# Patient Record
Sex: Male | Born: 1945 | Race: White | Hispanic: No | Smoking: Never smoker
Health system: Southern US, Community
[De-identification: ages and names within clinical notes are randomized; demographics above are authoritative.]

## PROBLEM LIST (undated history)

## (undated) DIAGNOSIS — I509 Heart failure, unspecified: Secondary | ICD-10-CM

## (undated) DIAGNOSIS — M109 Gout, unspecified: Secondary | ICD-10-CM

## (undated) DIAGNOSIS — D649 Anemia, unspecified: Secondary | ICD-10-CM

## (undated) DIAGNOSIS — C439 Malignant melanoma of skin, unspecified: Secondary | ICD-10-CM

## (undated) DIAGNOSIS — I255 Ischemic cardiomyopathy: Secondary | ICD-10-CM

## (undated) DIAGNOSIS — E7800 Pure hypercholesterolemia, unspecified: Secondary | ICD-10-CM

## (undated) DIAGNOSIS — E119 Type 2 diabetes mellitus without complications: Secondary | ICD-10-CM

## (undated) DIAGNOSIS — I214 Non-ST elevation (NSTEMI) myocardial infarction: Secondary | ICD-10-CM

## (undated) DIAGNOSIS — C4441 Basal cell carcinoma of skin of scalp and neck: Secondary | ICD-10-CM

## (undated) DIAGNOSIS — I10 Essential (primary) hypertension: Secondary | ICD-10-CM

## (undated) DIAGNOSIS — IMO0002 Reserved for concepts with insufficient information to code with codable children: Secondary | ICD-10-CM

## (undated) DIAGNOSIS — I252 Old myocardial infarction: Secondary | ICD-10-CM

## (undated) DIAGNOSIS — M1990 Unspecified osteoarthritis, unspecified site: Secondary | ICD-10-CM

## (undated) DIAGNOSIS — N184 Chronic kidney disease, stage 4 (severe): Secondary | ICD-10-CM

## (undated) HISTORY — DX: Ischemic cardiomyopathy: I25.5

## (undated) HISTORY — PX: MELANOMA EXCISION: SHX5266

## (undated) HISTORY — DX: Heart failure, unspecified: I50.9

## (undated) HISTORY — DX: Chronic kidney disease, stage 4 (severe): N18.4

## (undated) HISTORY — DX: Old myocardial infarction: I25.2

## (undated) HISTORY — PX: MOHS SURGERY: SUR867

## (undated) HISTORY — DX: Non-ST elevation (NSTEMI) myocardial infarction: I21.4

---
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
## 1997-03-11 HISTORY — PX: CHOLECYSTECTOMY: SHX55

## 1997-03-11 HISTORY — PX: ENUCLEATION: SHX628

## 1999-03-12 HISTORY — PX: BELOW KNEE LEG AMPUTATION: SUR23

## 2006-03-23 ENCOUNTER — Emergency Department (HOSPITAL_COMMUNITY): Admission: EM | Admit: 2006-03-23 | Discharge: 2006-03-23 | Payer: Self-pay | Admitting: Emergency Medicine

## 2012-12-09 HISTORY — PX: CORONARY ANGIOPLASTY WITH STENT PLACEMENT: SHX49

## 2013-01-28 ENCOUNTER — Telehealth: Payer: Self-pay | Admitting: Cardiology

## 2013-01-28 NOTE — Telephone Encounter (Signed)
ROI faxed to [facility]

## 2013-02-03 ENCOUNTER — Encounter (INDEPENDENT_AMBULATORY_CARE_PROVIDER_SITE_OTHER): Payer: Self-pay

## 2013-02-03 ENCOUNTER — Other Ambulatory Visit: Payer: Self-pay | Admitting: *Deleted

## 2013-02-03 ENCOUNTER — Encounter: Payer: Self-pay | Admitting: Cardiology

## 2013-02-03 ENCOUNTER — Ambulatory Visit (INDEPENDENT_AMBULATORY_CARE_PROVIDER_SITE_OTHER): Payer: Medicare HMO | Admitting: Cardiology

## 2013-02-03 VITALS — BP 148/78 | HR 82 | Ht 74.0 in | Wt 221.0 lb

## 2013-02-03 DIAGNOSIS — I2510 Atherosclerotic heart disease of native coronary artery without angina pectoris: Secondary | ICD-10-CM

## 2013-02-03 DIAGNOSIS — E1159 Type 2 diabetes mellitus with other circulatory complications: Secondary | ICD-10-CM

## 2013-02-03 DIAGNOSIS — S88119A Complete traumatic amputation at level between knee and ankle, unspecified lower leg, initial encounter: Secondary | ICD-10-CM

## 2013-02-03 DIAGNOSIS — I798 Other disorders of arteries, arterioles and capillaries in diseases classified elsewhere: Secondary | ICD-10-CM

## 2013-02-03 DIAGNOSIS — Z89511 Acquired absence of right leg below knee: Secondary | ICD-10-CM

## 2013-02-03 DIAGNOSIS — E785 Hyperlipidemia, unspecified: Secondary | ICD-10-CM

## 2013-02-03 DIAGNOSIS — N184 Chronic kidney disease, stage 4 (severe): Secondary | ICD-10-CM

## 2013-02-03 DIAGNOSIS — I252 Old myocardial infarction: Secondary | ICD-10-CM

## 2013-02-03 DIAGNOSIS — I208 Other forms of angina pectoris: Secondary | ICD-10-CM

## 2013-02-03 DIAGNOSIS — E1151 Type 2 diabetes mellitus with diabetic peripheral angiopathy without gangrene: Secondary | ICD-10-CM

## 2013-02-03 DIAGNOSIS — I2589 Other forms of chronic ischemic heart disease: Secondary | ICD-10-CM

## 2013-02-03 DIAGNOSIS — I255 Ischemic cardiomyopathy: Secondary | ICD-10-CM | POA: Insufficient documentation

## 2013-02-03 HISTORY — DX: Old myocardial infarction: I25.2

## 2013-02-03 LAB — BASIC METABOLIC PANEL
BUN: 47 mg/dL — ABNORMAL HIGH (ref 6–23)
CO2: 21 mEq/L (ref 19–32)
Chloride: 112 mEq/L (ref 96–112)
Creatinine, Ser: 2.6 mg/dL — ABNORMAL HIGH (ref 0.4–1.5)
Potassium: 4.8 mEq/L (ref 3.5–5.1)

## 2013-02-03 MED ORDER — ROSUVASTATIN CALCIUM 10 MG PO TABS: ORAL_TABLET | ORAL | Status: DC

## 2013-02-03 MED ORDER — ROSUVASTATIN CALCIUM 10 MG PO TABS: 10.0000 mg | ORAL_TABLET | Freq: Every day | ORAL | Status: DC
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
## 2013-02-03 NOTE — Patient Instructions (Signed)
Your physician recommends that you schedule a follow-up appointment in: 3 MONTHS WITH DR Anne Fu  START CRESTOR 5 MG ONCE WEEKLY  Your physician recommends that you HAVE LAB WORK TODAY

## 2013-02-19 ENCOUNTER — Encounter: Payer: Self-pay | Admitting: Cardiology

## 2013-03-31 ENCOUNTER — Other Ambulatory Visit (HOSPITAL_COMMUNITY): Payer: Self-pay | Admitting: Cardiology

## 2013-03-31 DIAGNOSIS — N179 Acute kidney failure, unspecified: Secondary | ICD-10-CM

## 2013-04-05 ENCOUNTER — Ambulatory Visit (HOSPITAL_COMMUNITY): Payer: Medicare HMO | Attending: Cardiology

## 2013-04-05 ENCOUNTER — Encounter: Payer: Self-pay | Admitting: Cardiology

## 2013-04-05 DIAGNOSIS — I10 Essential (primary) hypertension: Secondary | ICD-10-CM

## 2013-04-05 DIAGNOSIS — E785 Hyperlipidemia, unspecified: Secondary | ICD-10-CM | POA: Insufficient documentation

## 2013-04-05 DIAGNOSIS — I701 Atherosclerosis of renal artery: Secondary | ICD-10-CM | POA: Insufficient documentation

## 2013-04-05 DIAGNOSIS — E119 Type 2 diabetes mellitus without complications: Secondary | ICD-10-CM | POA: Insufficient documentation

## 2013-04-05 DIAGNOSIS — I129 Hypertensive chronic kidney disease with stage 1 through stage 4 chronic kidney disease, or unspecified chronic kidney disease: Secondary | ICD-10-CM | POA: Insufficient documentation

## 2013-04-05 DIAGNOSIS — N179 Acute kidney failure, unspecified: Secondary | ICD-10-CM

## 2013-04-05 DIAGNOSIS — N184 Chronic kidney disease, stage 4 (severe): Secondary | ICD-10-CM

## 2013-04-05 DIAGNOSIS — I2510 Atherosclerotic heart disease of native coronary artery without angina pectoris: Secondary | ICD-10-CM | POA: Insufficient documentation

## 2013-05-06 ENCOUNTER — Ambulatory Visit: Payer: Medicare HMO | Admitting: Cardiology

## 2013-06-02 ENCOUNTER — Encounter: Payer: Self-pay | Admitting: Cardiology

## 2013-06-02 ENCOUNTER — Ambulatory Visit (INDEPENDENT_AMBULATORY_CARE_PROVIDER_SITE_OTHER): Payer: Medicare HMO | Admitting: Cardiology

## 2013-06-02 VITALS — BP 160/77 | HR 66 | Ht 74.0 in | Wt 231.0 lb

## 2013-06-02 DIAGNOSIS — I2589 Other forms of chronic ischemic heart disease: Secondary | ICD-10-CM

## 2013-06-02 DIAGNOSIS — E7800 Pure hypercholesterolemia, unspecified: Secondary | ICD-10-CM

## 2013-06-02 DIAGNOSIS — N184 Chronic kidney disease, stage 4 (severe): Secondary | ICD-10-CM

## 2013-06-02 DIAGNOSIS — I252 Old myocardial infarction: Secondary | ICD-10-CM

## 2013-06-02 DIAGNOSIS — I2510 Atherosclerotic heart disease of native coronary artery without angina pectoris: Secondary | ICD-10-CM

## 2013-06-02 DIAGNOSIS — I255 Ischemic cardiomyopathy: Secondary | ICD-10-CM
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
## 2013-06-02 NOTE — Patient Instructions (Signed)
You have been referred to [facility]  ([facility]).  Your physician wants you to follow-up in: 6 months with Dr. Marlou Porch. You will receive a reminder letter in the mail two months in advance. If you don't receive a letter, please call our office to schedule the follow-up appointment.

## 2013-06-03 ENCOUNTER — Encounter (INDEPENDENT_AMBULATORY_CARE_PROVIDER_SITE_OTHER): Payer: Commercial Managed Care - HMO | Admitting: Pharmacist

## 2013-06-03 ENCOUNTER — Ambulatory Visit (INDEPENDENT_AMBULATORY_CARE_PROVIDER_SITE_OTHER): Payer: Commercial Managed Care - HMO | Admitting: Pharmacist

## 2013-06-03 VITALS — Wt 228.0 lb

## 2013-06-03 DIAGNOSIS — E785 Hyperlipidemia, unspecified: Secondary | ICD-10-CM | POA: Insufficient documentation

## 2013-06-03 DIAGNOSIS — Z79899 Other long term (current) drug therapy: Secondary | ICD-10-CM | POA: Diagnosis not present

## 2013-06-03 MED ORDER — EZETIMIBE 10 MG PO TABS: 10.0000 mg | ORAL_TABLET | Freq: Every day | ORAL | Status: DC

## 2013-06-03 NOTE — Assessment & Plan Note (Signed)
Patient and I discussed treatment options at length.  He is interested in enrolling into PCSK-9 study.  Would need to get some of his old records before attempting to enroll to make sure he met all criteria.  Would prefer to get patient on Zetia daily first to make sure he is on therapy (in case he gets randomized to placebo arm), and he is agreeable to this.  Cost may be an issue, but he states he wants to take this is possible, but will call if too expensive or if he has side effects.  Gave some samples and a voucher for Zetia today.  Will recheck blood work in 8 weeks, and if LDL still elevated, will try to enroll.  If he can't tolerate Zetia, will try to enroll as well.  He has had issues with constipation in the past, so won't try Welchol at this time (also expensive). Plan: 1.  Start Zetia 10 mg once daily. 2.  Get back to walking 3 miles daily. 3.  Continue to limit fried foods. 4.  Recheck cholesterol and liver in 8 weeks (07/28/13 fasting labs), and see Robert Villanueva 1 day later (07/29/13 at 2:00) 5.  Call Robert Villanueva at [phone] if you have side effects from [place], and will try to get you into clinical trial.

## 2013-06-03 NOTE — Patient Instructions (Signed)
1.  Start Zetia 10 mg once daily. 2.  Get back to walking 3 miles daily. 3.  Continue to limit fried foods. 4.  Recheck cholesterol and liver in 8 weeks (07/28/13 fasting labs), and see Ysidro Evert 1 day later (07/29/13 at 2:00) 5.  Call Ysidro Evert at [phone] if you have side effects from [place], and will try to get you into clinical trial.

## 2013-06-03 NOTE — Progress Notes (Signed)
Patient is a pleasant 68 y.o. WM referred to lipid clinic by Dr. Marlou Porch.  He has an MI 12/2012 and required PCI to RCA x 2.  He hasn't been able to tolerate pravastatin 20 mg qd or Crestor 5 mg qd.  He never tried once weekly crestor as mentioned in one of the notes, and he is certain he doesn't want to try statins again.  Cost for medication could be an issue for patient as well.  Patient interested in both non-statin options, and in enrolling into PCSK-9 inhibitor study.  He sees nephrology (Dr. Marval Regal) for his CKD.  His Scr is ~ 2.5 mg/dL.  Patient is single and lives alone.  He is originally from [place], then moved to [place] where he saw physicians from [place].  He moves up here in 2014.  His last cholesterol panel was 03/2013 by PCP ([facility]) and LDL was 64, TC 128, TG 151, HDL 34 while on Crestor 5 mg qd.  He has to stop this soon after due to muscle aches.  Patient had skin cancer removed 3 years ago by dermatologist at [facility].  Will get records sent to [place] to determine if he is a candidate for PCSK-9 in future based on cancer risk in the future.  RF:  CAD (MI and PCI), HTN, CKD, age, low HDL - LDL goal < 70, non-HDL goal < 100 Meds:  Not on lipid lowering meds. Intolerant:  Pravastatin 20 mg qd, Crestor 5 mg qd (muscle aches)  Diet:  Patient struggles with this as he is a single male and doesn't cook.  He often skips breakfast, and eats a chicken taco for lunch.  He doesn't eat fried foods or french fries when he eats out.  Dinner is typically eating out as well.  Typically chicken and vegetables.  Doesn't eat red meat or fried foods often.  Doesn't drink soda.  Rarely drinks alcohol. Rarely eats desserts or anything sweet.  He knows this isn't good, but this is what is realistic for him. Exercise:  Walking daily for 1-1.5 miles per day.  Plans on increasing to 3 miles daily as he use to do. Social history:  Non-smoker.  Drinks wine once or twice per month.  Labs: 03/2013:  When he was taking Crestor 5  mg qd (LDL 64, TC 128, TG 151, HDL 34) - stopped Crestor soon after.  No longer taking lipid lowering medication.  Current Outpatient Prescriptions  Medication Sig Dispense Refill  . aspirin 325 MG tablet Take 325 mg by mouth daily.      . clopidogrel (PLAVIX) 75 MG tablet Take 75 mg by mouth daily.       Marland Kitchen doxazosin (CARDURA) 4 MG tablet Take 4 mg by mouth daily.       . ergocalciferol (VITAMIN D2) 50000 UNITS capsule Take 50,000 Units by mouth once a week.      Marland Kitchen glipiZIDE (GLUCOTROL) 5 MG tablet Take 5 mg by mouth daily before breakfast.       . LANTUS 100 UNIT/ML injection Inject 6 Units into the skin. 8 units in evening      . metoprolol tartrate (LOPRESSOR) 25 MG tablet Take 25 mg by mouth 2 (two) times daily.       Marland Kitchen NITROSTAT 0.4 MG SL tablet Place 0.4 mg under the tongue every 5 (five) minutes as needed for chest pain.        No current facility-administered medications for this visit.   Allergies  Allergen Reactions  .  Crestor [Rosuvastatin]     Crestor 5 mg daily caused severe muscle aches  . Pravastatin     20 mg pravastatin caused muscle aches and itching   Family History  Problem Relation Age of Onset  . Cancer Mother   . Stroke Father   . Coronary artery disease Father   . Other Brother     Necrotic heart valve

## 2013-06-08 ENCOUNTER — Telehealth: Payer: Self-pay | Admitting: Cardiology

## 2013-06-08 NOTE — Telephone Encounter (Signed)
ROI faxed to [place]

## 2013-06-25 ENCOUNTER — Telehealth: Payer: Self-pay | Admitting: Cardiology

## 2013-06-25 NOTE — Telephone Encounter (Signed)
Records Rec From [facility] gave to Operator Room 4.17.15/kdm

## 2013-06-28 ENCOUNTER — Telehealth: Payer: Self-pay | Admitting: Cardiology

## 2013-06-28 NOTE — Telephone Encounter (Deleted)
Error

## 2013-07-09 NOTE — Telephone Encounter (Signed)
Please close encounter, Thanks! SR

## 2013-07-27 ENCOUNTER — Telehealth: Payer: Self-pay | Admitting: Cardiovascular Disease

## 2013-07-27 NOTE — Telephone Encounter (Signed)
New problem        [phone]   Dr Marval Regal would like this pt eval for a poss. c02 angiogram please.   Can we find an appropriate appointement time please.

## 2013-07-27 NOTE — Telephone Encounter (Signed)
I have left a voicemail for Dr [employer] office to contact me in regards to scheduling an appointment with Dr Fletcher Anon.

## 2013-07-27 NOTE — Telephone Encounter (Signed)
I spoke with Stanton Kidney and scheduled the pt for consult with Dr Fletcher Anon on 08/17/13. Stanton Kidney will fax renal duplex and office visit notes.

## 2013-07-28 ENCOUNTER — Other Ambulatory Visit (INDEPENDENT_AMBULATORY_CARE_PROVIDER_SITE_OTHER): Payer: Medicare HMO

## 2013-07-28 DIAGNOSIS — E785 Hyperlipidemia, unspecified: Secondary | ICD-10-CM

## 2013-07-28 DIAGNOSIS — Z79899 Other long term (current) drug therapy: Secondary | ICD-10-CM

## 2013-07-28 LAB — LIPID PANEL
CHOL/HDL RATIO: 6
Cholesterol: 170 mg/dL (ref 0–200)
HDL: 30.5 mg/dL — AB (ref >39.00)
LDL Cholesterol: 112 mg/dL — ABNORMAL HIGH (ref 0–99)
Triglycerides: 137 mg/dL (ref 0.0–149.0)
VLDL: 27.4 mg/dL (ref 0.0–40.0)

## 2013-07-28 LAB — HEPATIC FUNCTION PANEL
ALK PHOS: 143 U/L — AB (ref 39–117)
ALT: 18 U/L (ref 0–53)
AST: 14 U/L (ref 0–37)
Albumin: 3.6 g/dL (ref 3.5–5.2)
BILIRUBIN TOTAL: 0.5 mg/dL (ref 0.2–1.2)
Bilirubin, Direct: 0 mg/dL (ref 0.0–0.3)
Total Protein: 6.3 g/dL (ref 6.0–8.3)

## 2013-07-29 ENCOUNTER — Ambulatory Visit: Payer: Medicare HMO | Admitting: Pharmacist

## 2013-08-05 ENCOUNTER — Ambulatory Visit (INDEPENDENT_AMBULATORY_CARE_PROVIDER_SITE_OTHER): Payer: Commercial Managed Care - HMO | Admitting: Pharmacist

## 2013-08-05 VITALS — Wt 227.0 lb

## 2013-08-05 DIAGNOSIS — E785 Hyperlipidemia, unspecified: Secondary | ICD-10-CM

## 2013-08-05 NOTE — Progress Notes (Signed)
Patient is a pleasant 68 y.o. WM referred to lipid clinic by Dr. Marlou Porch.  He has an MI 12/2012 and required PCI to RCA x 2.  He hasn't been able to tolerate pravastatin 20 mg qd or Crestor 5 mg qd.  He never tried once weekly crestor as mentioned in one of the notes, and he is certain he doesn't want to try statins again.  Cost for medication could be an issue for patient as well.  Patient interested in both non-statin options, and in enrolling into PCSK-9 inhibitor study.  He sees nephrology (Dr. Marval Regal) for his CKD.  His Scr was ~  2.5 mg/dL in past, however patient thinks it improved recently.  Patient is single and lives alone.  He is originally from [place], then moved to [place] where he saw physicians from [place].  He moves up here in 2014. His cholesterol panel was 03/2013 by PCP ([facility]) and LDL was 64, TC 128, TG 151, HDL 34 while on Crestor 5 mg qd.  He has to stop this soon after due to muscle aches.  His LDL is 112 mg/dL now off therapy.  Wasn't able to stay on Zetia due to cost issues.  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  Patient interested in PCSK-9 inhibitor study, but wants to make sure that Dr. Marval Regal doesn't have any objection to this.  RF:  CAD (MI and PCI), HTN, CKD, age, low HDL - LDL goal < 70, non-HDL goal < 100 Meds:  Not on lipid lowering meds. Intolerant:  Pravastatin 20 mg qd ([facility]), Crestor 5 mg qd [facility] physicians), Crestor qweek [facility]) -muscle aches.  Diet:  Patient struggles with this as he is a single male and doesn't cook.  He often skips breakfast, and eats a chicken taco for lunch.  He doesn't eat fried foods or french fries when he eats out.  Dinner is typically eating out as well.  Typically chicken and vegetables.  Doesn't eat red meat or fried foods often.  Doesn't drink soda.  Rarely drinks alcohol. Rarely eats desserts or anything sweet.  He knows this isn't good, but this is what is realistic for  him. Exercise:  Walking daily for 1-1.5 miles per day.  Plans on increasing to 3 miles daily as he use to do. Social history:  Non-smoker.  Drinks wine once or twice per month.  Labs: 07/2013:  TC 170, LDL 112, HDL 31, TG 137 (off lipid lowering medication for past 6 week) 03/2013:  When he was taking Crestor 5 mg qd (LDL 64, TC 128, TG 151, HDL 34) - stopped Crestor soon after.  No longer taking lipid lowering medication.  Current Outpatient Prescriptions  Medication Sig Dispense Refill  . aspirin 325 MG tablet Take 325 mg by mouth daily.      . clopidogrel (PLAVIX) 75 MG tablet Take 75 mg by mouth daily.       Marland Kitchen doxazosin (CARDURA) 4 MG tablet Take 4 mg by mouth daily.       . ergocalciferol (VITAMIN D2) 50000 UNITS capsule Take 50,000 Units by mouth once a week.      Marland Kitchen glipiZIDE (GLUCOTROL) 5 MG tablet Take 5 mg by mouth daily before breakfast.       . LANTUS 100 UNIT/ML injection Inject 6 Units into the skin. 8 units in evening      . metoprolol tartrate (LOPRESSOR) 25 MG tablet Take 25  mg by mouth 2 (two) times daily.       Marland Kitchen NITROSTAT 0.4 MG SL tablet Place 0.4 mg under the tongue every 5 (five) minutes as needed for chest pain.       Marland Kitchen ezetimibe (ZETIA) 10 MG tablet Take 1 tablet (10 mg total) by mouth daily.  30 tablet  5   No current facility-administered medications for this visit.   Allergies  Allergen Reactions  . Crestor [Rosuvastatin]     Crestor 5 mg daily caused severe muscle aches  . Pravastatin     20 mg pravastatin caused muscle aches and itching   Family History  Problem Relation Age of Onset  . Cancer Mother   . Stroke Father   . Coronary artery disease Father   . Other Brother     Necrotic heart valve

## 2013-08-05 NOTE — Assessment & Plan Note (Addendum)
Patient interested in enrolling into [facility] / PCKS-9 inhibitor trial, however wants to make sure the Dr. Marval Regal doesn't have any objection.  He would like [employer] to call Dr. Marval Regal (nephrology) to make sure he has no objection to this.  Given it is a monoclonal antibody, and thus far no renal issues have been seen with this class of drugs, hopefully there will be no issue.  Suspect his eGRF will be > 30 ml/min and okay to proceed with study.  He has some records from [facility] demonstrating his failure of pravastatin in the past which will be helpful in getting patient into the statin intolerant arm of SPIRE-II.  I've spoke with Lynder Parents with [facility] who agrees to contact Dr. Marval Regal and pass on the study information to him.  Patient will be contacted by [facility] after they have spoken with Dr. Marval Regal, and if appropriate will try to get patient randomized.

## 2013-08-05 NOTE — Patient Instructions (Signed)
[employer] will follow up with you regarding clinical trial

## 2013-08-17 ENCOUNTER — Encounter (INDEPENDENT_AMBULATORY_CARE_PROVIDER_SITE_OTHER): Payer: Self-pay

## 2013-08-17 ENCOUNTER — Encounter: Payer: Self-pay | Admitting: Cardiovascular Disease

## 2013-08-17 ENCOUNTER — Ambulatory Visit (INDEPENDENT_AMBULATORY_CARE_PROVIDER_SITE_OTHER): Payer: Commercial Managed Care - HMO | Admitting: Cardiovascular Disease

## 2013-08-17 VITALS — BP 142/72 | HR 64 | Ht 74.0 in | Wt 215.1 lb

## 2013-08-17 DIAGNOSIS — I2510 Atherosclerotic heart disease of native coronary artery without angina pectoris: Secondary | ICD-10-CM

## 2013-08-17 DIAGNOSIS — I701 Atherosclerosis of renal artery: Secondary | ICD-10-CM | POA: Insufficient documentation
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
## 2013-08-17 NOTE — Patient Instructions (Signed)
Your physician has requested that you have a peripheral vascular angiogram. This exam is performed at the hospital. During this exam IV contrast is used to look at arterial blood flow. Please review the information sheet given for details.  Your physician recommends that you continue on your current medications as directed. Please refer to the Current Medication list given to you today.  

## 2013-08-17 NOTE — Assessment & Plan Note (Signed)
He has no symptoms of angina. Continue medical therapy. 

## 2013-08-17 NOTE — Assessment & Plan Note (Addendum)
The patient possibly has significant right renal artery stenosis. If that's confirmed by angiography, then renal artery stenting would be indicated for renal preservation given the degree of chronic kidney disease. Right renal size was normal by ultrasound. Obviously, the other etiologies for chronic kidney disease could be due to his underlying medical conditions including prolonged diabetes and hypertension. Renal artery revascularization would only be indicated if there is high-grade stenosis. I recommend proceeding with renal artery angiography using CO2 and possible renal artery stenting. Risks and benefits were discussed with the patient. He is already on dual antiplatelet therapy.

## 2013-09-07 ENCOUNTER — Encounter (HOSPITAL_COMMUNITY): Payer: Self-pay | Admitting: Pharmacy Technician

## 2013-09-14 ENCOUNTER — Other Ambulatory Visit: Payer: Self-pay | Admitting: Cardiovascular Disease

## 2013-09-14 DIAGNOSIS — I701 Atherosclerosis of renal artery: Secondary | ICD-10-CM

## 2013-09-15 ENCOUNTER — Inpatient Hospital Stay (HOSPITAL_COMMUNITY)
Admission: RE | Admit: 2013-09-15 | Discharge: 2013-09-17 | DRG: 698 | Disposition: A | Discharge status: Home / Self Care | Payer: Medicare HMO | Source: Ambulatory Visit | Attending: Cardiovascular Disease | Admitting: Cardiovascular Disease

## 2013-09-15 ENCOUNTER — Encounter (HOSPITAL_COMMUNITY): Payer: Self-pay | Admitting: General Practice

## 2013-09-15 ENCOUNTER — Inpatient Hospital Stay (HOSPITAL_COMMUNITY): Payer: Medicare HMO

## 2013-09-15 ENCOUNTER — Encounter (HOSPITAL_COMMUNITY)
Admission: RE | Disposition: A | Discharge status: Home / Self Care | Payer: Self-pay | Source: Ambulatory Visit | Attending: Cardiovascular Disease

## 2013-09-15 DIAGNOSIS — N184 Chronic kidney disease, stage 4 (severe): Secondary | ICD-10-CM | POA: Diagnosis present

## 2013-09-15 DIAGNOSIS — I129 Hypertensive chronic kidney disease with stage 1 through stage 4 chronic kidney disease, or unspecified chronic kidney disease: Secondary | ICD-10-CM | POA: Diagnosis present

## 2013-09-15 DIAGNOSIS — N189 Chronic kidney disease, unspecified: Secondary | ICD-10-CM | POA: Diagnosis present

## 2013-09-15 DIAGNOSIS — E8779 Other fluid overload: Secondary | ICD-10-CM | POA: Diagnosis present

## 2013-09-15 DIAGNOSIS — I252 Old myocardial infarction: Secondary | ICD-10-CM

## 2013-09-15 DIAGNOSIS — Z7902 Long term (current) use of antithrombotics/antiplatelets: Secondary | ICD-10-CM | POA: Diagnosis not present

## 2013-09-15 DIAGNOSIS — R319 Hematuria, unspecified: Secondary | ICD-10-CM | POA: Diagnosis present

## 2013-09-15 DIAGNOSIS — Q619 Cystic kidney disease, unspecified: Secondary | ICD-10-CM | POA: Diagnosis not present

## 2013-09-15 DIAGNOSIS — D649 Anemia, unspecified: Secondary | ICD-10-CM | POA: Diagnosis present

## 2013-09-15 DIAGNOSIS — N179 Acute kidney failure, unspecified: Secondary | ICD-10-CM

## 2013-09-15 DIAGNOSIS — S88119A Complete traumatic amputation at level between knee and ankle, unspecified lower leg, initial encounter: Secondary | ICD-10-CM | POA: Diagnosis not present

## 2013-09-15 DIAGNOSIS — Z794 Long term (current) use of insulin: Secondary | ICD-10-CM

## 2013-09-15 DIAGNOSIS — E875 Hyperkalemia: Secondary | ICD-10-CM | POA: Diagnosis present

## 2013-09-15 DIAGNOSIS — E872 Acidosis, unspecified: Secondary | ICD-10-CM | POA: Diagnosis present

## 2013-09-15 DIAGNOSIS — Z89519 Acquired absence of unspecified leg below knee: Secondary | ICD-10-CM

## 2013-09-15 DIAGNOSIS — Z7982 Long term (current) use of aspirin: Secondary | ICD-10-CM | POA: Diagnosis not present

## 2013-09-15 DIAGNOSIS — I2589 Other forms of chronic ischemic heart disease: Secondary | ICD-10-CM | POA: Diagnosis present

## 2013-09-15 DIAGNOSIS — Z5309 Procedure and treatment not carried out because of other contraindication: Secondary | ICD-10-CM

## 2013-09-15 DIAGNOSIS — E1129 Type 2 diabetes mellitus with other diabetic kidney complication: Secondary | ICD-10-CM | POA: Diagnosis present

## 2013-09-15 DIAGNOSIS — Z9861 Coronary angioplasty status: Secondary | ICD-10-CM

## 2013-09-15 DIAGNOSIS — Z79899 Other long term (current) drug therapy: Secondary | ICD-10-CM | POA: Diagnosis not present

## 2013-09-15 DIAGNOSIS — I2510 Atherosclerotic heart disease of native coronary artery without angina pectoris: Secondary | ICD-10-CM | POA: Diagnosis present

## 2013-09-15 DIAGNOSIS — D631 Anemia in chronic kidney disease: Secondary | ICD-10-CM

## 2013-09-15 DIAGNOSIS — D638 Anemia in other chronic diseases classified elsewhere: Secondary | ICD-10-CM | POA: Diagnosis present

## 2013-09-15 DIAGNOSIS — N170 Acute kidney failure with tubular necrosis: Secondary | ICD-10-CM | POA: Diagnosis present

## 2013-09-15 DIAGNOSIS — N289 Disorder of kidney and ureter, unspecified: Secondary | ICD-10-CM

## 2013-09-15 DIAGNOSIS — E785 Hyperlipidemia, unspecified: Secondary | ICD-10-CM | POA: Diagnosis present

## 2013-09-15 DIAGNOSIS — I701 Atherosclerosis of renal artery: Principal | ICD-10-CM | POA: Diagnosis present

## 2013-09-15 DIAGNOSIS — Z8614 Personal history of Methicillin resistant Staphylococcus aureus infection: Secondary | ICD-10-CM | POA: Diagnosis not present

## 2013-09-15 DIAGNOSIS — I255 Ischemic cardiomyopathy: Secondary | ICD-10-CM | POA: Diagnosis present

## 2013-09-15 HISTORY — DX: Reserved for concepts with insufficient information to code with codable children: IMO0002

## 2013-09-15 HISTORY — DX: Basal cell carcinoma of skin of scalp and neck: C44.41

## 2013-09-15 HISTORY — DX: Essential (primary) hypertension: I10

## 2013-09-15 HISTORY — DX: Malignant melanoma of skin, unspecified: C43.9

## 2013-09-15 HISTORY — DX: Gout, unspecified: M10.9

## 2013-09-15 HISTORY — DX: Anemia, unspecified: D64.9

## 2013-09-15 HISTORY — DX: Type 2 diabetes mellitus without complications: E11.9

## 2013-09-15 HISTORY — DX: Unspecified osteoarthritis, unspecified site: M19.90

## 2013-09-15 HISTORY — DX: Pure hypercholesterolemia, unspecified: E78.00

## 2013-09-15 LAB — MAGNESIUM: MAGNESIUM: 1.5 mg/dL (ref 1.5–2.5)

## 2013-09-15 LAB — BASIC METABOLIC PANEL
Anion gap: 14 (ref 5–15)
Anion gap: 13 (ref 5–15)
BUN: 60 mg/dL — ABNORMAL HIGH (ref 6–23)
BUN: 58 mg/dL — ABNORMAL HIGH (ref 6–23)
CALCIUM: 8.1 mg/dL — AB (ref 8.4–10.5)
CALCIUM: 8.3 mg/dL — AB (ref 8.4–10.5)
CHLORIDE: 111 meq/L (ref 96–112)
CO2: 16 mEq/L — ABNORMAL LOW (ref 19–32)
CO2: 18 meq/L — AB (ref 19–32)
CREATININE: 3.24 mg/dL — AB (ref 0.50–1.35)
Chloride: 110 mEq/L (ref 96–112)
Creatinine, Ser: 3.16 mg/dL — ABNORMAL HIGH (ref 0.50–1.35)
GFR calc Af Amer: 22 mL/min — ABNORMAL LOW (ref >90)
GFR calc non Af Amer: 19 mL/min — ABNORMAL LOW (ref >90)
GFR, EST AFRICAN AMERICAN: 21 mL/min — AB (ref >90)
GFR, EST NON AFRICAN AMERICAN: 18 mL/min — AB (ref >90)
GLUCOSE: 113 mg/dL — AB (ref 70–99)
Glucose, Bld: 111 mg/dL — ABNORMAL HIGH (ref 70–99)
Potassium: 5.7 mEq/L — ABNORMAL HIGH (ref 3.7–5.3)
Potassium: 6 mEq/L — ABNORMAL HIGH (ref 3.7–5.3)
SODIUM: 142 meq/L (ref 137–147)
Sodium: 140 mEq/L (ref 137–147)

## 2013-09-15 LAB — URINALYSIS, ROUTINE W REFLEX MICROSCOPIC
BILIRUBIN URINE: NEGATIVE
GLUCOSE, UA: NEGATIVE mg/dL
Ketones, ur: NEGATIVE mg/dL
Leukocytes, UA: NEGATIVE
Nitrite: NEGATIVE
Protein, ur: >300 mg/dL — AB
Specific Gravity, Urine: 1.013 (ref 1.005–1.030)
UROBILINOGEN UA: 0.2 mg/dL (ref 0.0–1.0)
pH: 5.5 (ref 5.0–8.0)

## 2013-09-15 LAB — PROTIME-INR
INR: 1.03 (ref 0.00–1.49)
Prothrombin Time: 13.5 seconds (ref 11.6–15.2)

## 2013-09-15 LAB — URINE MICROSCOPIC-ADD ON

## 2013-09-15 LAB — HEMOGLOBIN A1C
Hgb A1c MFr Bld: 5.3 % (ref <5.7)
MEAN PLASMA GLUCOSE: 105 mg/dL (ref <117)

## 2013-09-15 LAB — CBC
HEMATOCRIT: 26.1 % — AB (ref 39.0–52.0)
HEMOGLOBIN: 8.6 g/dL — AB (ref 13.0–17.0)
MCH: 29.3 pg (ref 26.0–34.0)
MCHC: 33 g/dL (ref 30.0–36.0)
MCV: 88.8 fL (ref 78.0–100.0)
Platelets: 142 10*3/uL — ABNORMAL LOW (ref 150–400)
RBC: 2.94 MIL/uL — ABNORMAL LOW (ref 4.22–5.81)
RDW: 13.6 % (ref 11.5–15.5)
WBC: 5.8 10*3/uL (ref 4.0–10.5)

## 2013-09-15 LAB — GLUCOSE, CAPILLARY
GLUCOSE-CAPILLARY: 133 mg/dL — AB (ref 70–99)
Glucose-Capillary: 115 mg/dL — ABNORMAL HIGH (ref 70–99)
Glucose-Capillary: 108 mg/dL — ABNORMAL HIGH (ref 70–99)

## 2013-09-15 LAB — SODIUM, URINE, RANDOM: Sodium, Ur: 107 mEq/L

## 2013-09-15 LAB — CREATININE, URINE, RANDOM: Creatinine, Urine: 37.3 mg/dL

## 2013-09-15 SURGERY — RENAL ANGIOGRAM
Anesthesia: LOCAL

## 2013-09-15 MED ORDER — ZOLPIDEM TARTRATE 5 MG PO TABS: 5.0000 mg | ORAL_TABLET | Freq: Every evening | ORAL | Status: DC | PRN

## 2013-09-15 MED ORDER — SORBITOL 70 % SOLN
30.0000 mL | Status: DC | PRN
  Filled 2013-09-15: qty 30

## 2013-09-15 MED ORDER — GLIPIZIDE 5 MG PO TABS
5.0000 mg | ORAL_TABLET | Freq: Two times a day (BID) | ORAL | Status: DC
  Administered 2013-09-15 – 2013-09-17 (×4): 5 mg via ORAL
  Filled 2013-09-15 (×6): qty 1

## 2013-09-15 MED ORDER — ASPIRIN 81 MG PO CHEW: 81.0000 mg | CHEWABLE_TABLET | ORAL | Status: DC

## 2013-09-15 MED ORDER — SODIUM CHLORIDE 0.9 % IV SOLN
INTRAVENOUS | Status: AC
  Administered 2013-09-15: 18:00:00 via INTRAVENOUS

## 2013-09-15 MED ORDER — INSULIN ASPART 100 UNIT/ML ~~LOC~~ SOLN
0.0000 [IU] | Freq: Three times a day (TID) | SUBCUTANEOUS | Status: DC
  Administered 2013-09-16: 1 [IU] via SUBCUTANEOUS

## 2013-09-15 MED ORDER — CALCIUM CARBONATE 1250 MG/5ML PO SUSP: 500.0000 mg | Freq: Four times a day (QID) | ORAL | Status: DC | PRN

## 2013-09-15 MED ORDER — SODIUM CHLORIDE 0.45 % IV SOLN: INTRAVENOUS | Status: DC

## 2013-09-15 MED ORDER — DOCUSATE SODIUM 283 MG RE ENEM: 1.0000 | ENEMA | RECTAL | Status: DC | PRN

## 2013-09-15 MED ORDER — PNEUMOCOCCAL VAC POLYVALENT 25 MCG/0.5ML IJ INJ
0.5000 mL | INJECTION | INTRAMUSCULAR | Status: AC
  Administered 2013-09-16: 0.5 mL via INTRAMUSCULAR
  Filled 2013-09-15: qty 0.5

## 2013-09-15 MED ORDER — HYDROXYZINE HCL 25 MG PO TABS: 25.0000 mg | ORAL_TABLET | Freq: Three times a day (TID) | ORAL | Status: DC | PRN

## 2013-09-15 MED ORDER — ASPIRIN EC 81 MG PO TBEC
81.0000 mg | DELAYED_RELEASE_TABLET | Freq: Every day | ORAL | Status: DC
  Administered 2013-09-16 – 2013-09-17 (×2): 81 mg via ORAL
  Filled 2013-09-15 (×2): qty 1

## 2013-09-15 MED ORDER — DOXAZOSIN MESYLATE 4 MG PO TABS
4.0000 mg | ORAL_TABLET | Freq: Every day | ORAL | Status: DC
  Administered 2013-09-15 – 2013-09-17 (×3): 4 mg via ORAL
  Filled 2013-09-15 (×4): qty 1

## 2013-09-15 MED ORDER — ONDANSETRON HCL 4 MG/2ML IJ SOLN: 4.0000 mg | Freq: Four times a day (QID) | INTRAMUSCULAR | Status: DC | PRN

## 2013-09-15 MED ORDER — ENOXAPARIN SODIUM 30 MG/0.3ML ~~LOC~~ SOLN
30.0000 mg | SUBCUTANEOUS | Status: DC
  Administered 2013-09-15 – 2013-09-16 (×2): 30 mg via SUBCUTANEOUS
  Filled 2013-09-15 (×3): qty 0.3

## 2013-09-15 MED ORDER — NEPRO/CARBSTEADY PO LIQD: 237.0000 mL | Freq: Three times a day (TID) | ORAL | Status: DC | PRN

## 2013-09-15 MED ORDER — SODIUM CHLORIDE 0.9 % IJ SOLN: 3.0000 mL | INTRAMUSCULAR | Status: DC | PRN

## 2013-09-15 MED ORDER — SODIUM POLYSTYRENE SULFONATE 15 GM/60ML PO SUSP
30.0000 g | Freq: Once | ORAL | Status: AC
  Administered 2013-09-15: 30 g via ORAL
  Filled 2013-09-15: qty 120

## 2013-09-15 MED ORDER — SODIUM CHLORIDE 0.9 % IV SOLN
INTRAVENOUS | Status: DC
  Administered 2013-09-15: 08:00:00 via INTRAVENOUS

## 2013-09-15 MED ORDER — ALPRAZOLAM 0.25 MG PO TABS: 0.2500 mg | ORAL_TABLET | Freq: Two times a day (BID) | ORAL | Status: DC | PRN

## 2013-09-15 MED ORDER — SODIUM CHLORIDE 0.9 % IV SOLN: 250.0000 mL | INTRAVENOUS | Status: DC | PRN

## 2013-09-15 MED ORDER — ASPIRIN 81 MG PO CHEW
CHEWABLE_TABLET | ORAL | Status: AC
  Administered 2013-09-15: 81 mg
  Filled 2013-09-15: qty 1

## 2013-09-15 MED ORDER — CLOPIDOGREL BISULFATE 75 MG PO TABS
75.0000 mg | ORAL_TABLET | Freq: Every day | ORAL | Status: DC
  Administered 2013-09-15 – 2013-09-17 (×3): 75 mg via ORAL
  Filled 2013-09-15 (×4): qty 1

## 2013-09-15 MED ORDER — ACETAMINOPHEN 650 MG RE SUPP: 650.0000 mg | Freq: Four times a day (QID) | RECTAL | Status: DC | PRN

## 2013-09-15 MED ORDER — NITROGLYCERIN 0.4 MG SL SUBL
0.4000 mg | SUBLINGUAL_TABLET | SUBLINGUAL | Status: DC | PRN
Start: 2013-09-15 — End: 2013-09-17

## 2013-09-15 MED ORDER — INSULIN GLARGINE 100 UNIT/ML ~~LOC~~ SOLN
6.0000 [IU] | Freq: Every day | SUBCUTANEOUS | Status: DC
  Administered 2013-09-15 – 2013-09-16 (×2): 6 [IU] via SUBCUTANEOUS
  Filled 2013-09-15 (×3): qty 0.06

## 2013-09-15 MED ORDER — ONDANSETRON HCL 4 MG/2ML IJ SOLN
4.0000 mg | Freq: Four times a day (QID) | INTRAMUSCULAR | Status: DC | PRN
Start: 2013-09-15 — End: 2013-09-17

## 2013-09-15 MED ORDER — ACETAMINOPHEN 325 MG PO TABS: 650.0000 mg | ORAL_TABLET | Freq: Four times a day (QID) | ORAL | Status: DC | PRN

## 2013-09-15 MED ORDER — ONDANSETRON HCL 4 MG PO TABS: 4.0000 mg | ORAL_TABLET | Freq: Four times a day (QID) | ORAL | Status: DC | PRN

## 2013-09-15 MED ORDER — SODIUM CHLORIDE 0.9 % IJ SOLN: 3.0000 mL | Freq: Two times a day (BID) | INTRAMUSCULAR | Status: DC

## 2013-09-15 MED ORDER — CAMPHOR-MENTHOL 0.5-0.5 % EX LOTN: 1.0000 "application " | TOPICAL_LOTION | Freq: Three times a day (TID) | CUTANEOUS | Status: DC | PRN

## 2013-09-15 MED ORDER — AMLODIPINE BESYLATE 10 MG PO TABS
10.0000 mg | ORAL_TABLET | Freq: Every day | ORAL | Status: DC
  Administered 2013-09-15 – 2013-09-17 (×3): 10 mg via ORAL
  Filled 2013-09-15 (×4): qty 1

## 2013-09-15 MED ORDER — SODIUM BICARBONATE 650 MG PO TABS
650.0000 mg | ORAL_TABLET | Freq: Two times a day (BID) | ORAL | Status: DC
  Administered 2013-09-15 – 2013-09-17 (×4): 650 mg via ORAL
  Filled 2013-09-15 (×5): qty 1

## 2013-09-15 MED ORDER — ACETAMINOPHEN 325 MG PO TABS: 650.0000 mg | ORAL_TABLET | ORAL | Status: DC | PRN

## 2013-09-15 MED ORDER — METOPROLOL TARTRATE 25 MG PO TABS
25.0000 mg | ORAL_TABLET | Freq: Two times a day (BID) | ORAL | Status: DC
  Administered 2013-09-15 – 2013-09-17 (×4): 25 mg via ORAL
  Filled 2013-09-15 (×5): qty 1

## 2013-09-15 NOTE — Progress Notes (Signed)
BMP lab values reported to Dr. Fletcher Anon.  Order received to admit pt to tele obs. Bed requested. Pt informed.

## 2013-09-15 NOTE — Consult Note (Signed)
I have personally seen and examined this patient and agree with the assessment/plan as outlined above by Denton Brick MD (PGY2). Will give gentle IVFs and work up AKI as we treat hyperkalemia and metabolic acidosis. May indeed be the step-wise renal function decline seen with RAS/ischemic nephropathy Jacora Hopkins K.,MD 09/15/2013 5:15 PM

## 2013-09-15 NOTE — H&P (Signed)
Addendum:  Primary cardiologist: Dr. Marlou Porch PV Cardiologist: Dr. Fletcher Anon Renal MD: Dr. Arty Baumgartner  [name] was referred to Dr. Fletcher Anon for RAS assessment and treatment. A PV cath with possible renal artery stenting was planned. The patient came to the hospital for this on 07/08. For other details of his medical history, etc., see H&P dated 08/17/2013.   [name] came to the hospital as scheduled, but his renal function is worse than usual and he is anemic. He is being admitted, with Dr. Birdie Riddle team to consult and manage hydration as deemed appropriate, based on his renal function and EF 45%.   [name] has not had chest pain and does not get SOB. He did have some problems with diarrhea last week and PO intake was lower than usual. He currently feels well and denies orthostatic symptoms.  BP 183/75  Pulse 72  Temp(Src) 97.4 F (36.3 C) (Oral)  Resp 18  Ht 6\' 1"  (1.854 m)  Wt 215 lb (97.523 kg)  BMI 28.37 kg/m2  SpO2 100% General: Well developed, well nourished, male in no acute distress Head: Eyes PERRLA, No xanthomas.   Normocephalic and atraumatic  Lungs: Clear bilaterally to auscultation. Heart: HRRR S1 S2, without MRG.  Pulses are 2+ & equal in upper extremities. LLE with decreased but palpable pulse, 1+.. S/p RLE BKA. No carotid bruit. No JVD. No femoral bruits. Abdomen: Bowel sounds are present, abdomen soft and non-tender without masses or  hernias noted. Msk: Normal strength and tone for age. Extremities: No clubbing, cyanosis or edema.    Skin:  No rashes or lesions noted. Neuro: Alert and oriented X 3. Psych:  Good affect, responds appropriately   Lab Results  Component Value Date   WBC 5.8 09/15/2013   HGB 8.6* 09/15/2013   HCT 26.1* 09/15/2013   MCV 88.8 09/15/2013   PLT 142* 09/15/2013    Recent Labs  09/15/13 0738  INR 1.03     Recent Labs Lab 09/15/13 1316  NA 142  K 6.0*  CL 111  CO2 18*  BUN 58*  CREATININE 3.16*  CALCIUM 8.3*  GLUCOSE  111*   A/P:  Principal Problem:   RAS (renal artery stenosis) - PV cath when stable for the procedure  Active Problems:   Chronic kidney disease, stage 4, severely decreased GFR - renal to see and manage, will start gentle hydration as he may be volume-depleted. Pt is not familiar with renal diet, will start this, changes per renal team.     Cardiomyopathy, ischemic - will need to watch volume status carefully with hydration    Anemia - Likely of chronic disease, will let Nephrology manage.    Hyperkalemia - had Kayexalate, follow levels, renal diet (pt ate bananas last week because of his GI illness)  Otherwise, continue home medications.
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
## 2013-09-16 DIAGNOSIS — N184 Chronic kidney disease, stage 4 (severe): Secondary | ICD-10-CM

## 2013-09-16 DIAGNOSIS — I701 Atherosclerosis of renal artery: Principal | ICD-10-CM

## 2013-09-16 DIAGNOSIS — I252 Old myocardial infarction: Secondary | ICD-10-CM

## 2013-09-16 DIAGNOSIS — I2589 Other forms of chronic ischemic heart disease: Secondary | ICD-10-CM

## 2013-09-16 DIAGNOSIS — I2510 Atherosclerotic heart disease of native coronary artery without angina pectoris: Secondary | ICD-10-CM

## 2013-09-16 LAB — COMPREHENSIVE METABOLIC PANEL
ALT: 29 U/L (ref 0–53)
AST: 21 U/L (ref 0–37)
Albumin: 3.2 g/dL — ABNORMAL LOW (ref 3.5–5.2)
Alkaline Phosphatase: 185 U/L — ABNORMAL HIGH (ref 39–117)
Anion gap: 13 (ref 5–15)
BILIRUBIN TOTAL: 0.3 mg/dL (ref 0.3–1.2)
BUN: 53 mg/dL — ABNORMAL HIGH (ref 6–23)
CHLORIDE: 111 meq/L (ref 96–112)
CO2: 21 mEq/L (ref 19–32)
Calcium: 8.3 mg/dL — ABNORMAL LOW (ref 8.4–10.5)
Creatinine, Ser: 3.07 mg/dL — ABNORMAL HIGH (ref 0.50–1.35)
GFR, EST AFRICAN AMERICAN: 22 mL/min — AB (ref >90)
GFR, EST NON AFRICAN AMERICAN: 19 mL/min — AB (ref >90)
GLUCOSE: 94 mg/dL (ref 70–99)
Potassium: 5.6 mEq/L — ABNORMAL HIGH (ref 3.7–5.3)
SODIUM: 145 meq/L (ref 137–147)
Total Protein: 6.1 g/dL (ref 6.0–8.3)

## 2013-09-16 LAB — URINALYSIS, ROUTINE W REFLEX MICROSCOPIC
Bilirubin Urine: NEGATIVE
GLUCOSE, UA: NEGATIVE mg/dL
Ketones, ur: NEGATIVE mg/dL
LEUKOCYTES UA: NEGATIVE
Nitrite: NEGATIVE
PH: 5.5 (ref 5.0–8.0)
Protein, ur: >300 mg/dL — AB
Specific Gravity, Urine: 1.018 (ref 1.005–1.030)
Urobilinogen, UA: 0.2 mg/dL (ref 0.0–1.0)

## 2013-09-16 LAB — GLUCOSE, CAPILLARY
GLUCOSE-CAPILLARY: 129 mg/dL — AB (ref 70–99)
Glucose-Capillary: 96 mg/dL (ref 70–99)

## 2013-09-16 LAB — IRON AND TIBC
IRON: 67 ug/dL (ref 42–135)
Saturation Ratios: 30 % (ref 20–55)
TIBC: 224 ug/dL (ref 215–435)
UIBC: 157 ug/dL (ref 125–400)

## 2013-09-16 LAB — URINE MICROSCOPIC-ADD ON

## 2013-09-16 LAB — FERRITIN: Ferritin: 174 ng/mL (ref 22–322)

## 2013-09-16 MED ORDER — SODIUM POLYSTYRENE SULFONATE 15 GM/60ML PO SUSP
30.0000 g | Freq: Once | ORAL | Status: AC
  Administered 2013-09-16: 30 g via ORAL
  Filled 2013-09-16: qty 120

## 2013-09-16 MED ORDER — SODIUM POLYSTYRENE SULFONATE 15 GM/60ML PO SUSP
60.0000 g | Freq: Once | ORAL | Status: DC
  Filled 2013-09-16: qty 240

## 2013-09-16 NOTE — Progress Notes (Signed)
S: No complaints today. Some leg swelling, no nausea or vomiting, good appetite and tolerating diet.  O:BP 180/78  Pulse 72  Temp(Src) 98.1 F (36.7 C) (Oral)  Resp 20  Ht 6\' 1"  (1.854 m)  Wt 223 lb 1.6 oz (101.197 kg)  BMI 29.44 kg/m2  SpO2 100%  Intake/Output Summary (Last 24 hours) at 09/16/13 0833 Last data filed at 09/16/13 0748  Gross per 24 hour  Intake    240 ml  Output   1925 ml  Net  -1685 ml   Intake/Output: I/O last 3 completed shifts: In: 240 [P.O.:240] Out: 1525 [Urine:1525]  Intake/Output this shift:  Total I/O In: -  Out: 400 [Urine:400] Weight change: 8 lb 1.6 oz (3.674 kg) Gen: NAD, eating breakfast Heent: AT, [place], EOMI, moist oral mucosa  CVS: Regular rate and rhythm, no added sounds Resp: Clear to auscultation bilat, no crackles.  Abd: Soft, non tender, bowel sounds present Ext:+ 1 pitting pedal edema.   Recent Labs Lab 09/15/13 0738 09/15/13 1316 09/16/13 0334  NA 140 142 145  K 5.7* 6.0* 5.6*  CL 110 111 111  CO2 16* 18* 21  GLUCOSE 113* 111* 94  BUN 60* 58* 53*  CREATININE 3.24* 3.16* 3.07*  ALBUMIN  --   --  3.2*  CALCIUM 8.1* 8.3* 8.3*  AST  --   --  21  ALT  --   --  29   Liver Function Tests:  Recent Labs Lab 09/16/13 0334  AST 21  ALT 29  ALKPHOS 185*  BILITOT 0.3  PROT 6.1  ALBUMIN 3.2*   CBC:  Recent Labs Lab 09/15/13 0738  WBC 5.8  HGB 8.6*  HCT 26.1*  MCV 88.8  PLT 142*   CBG:  Recent Labs Lab 09/15/13 0713 09/15/13 1621 09/15/13 2201 09/16/13 0625  GLUCAP 115* 133* 108* 96    Iron Studies:  Recent Labs  09/15/13 1847  IRON 67  TIBC 224  FERRITIN 174   Studies/Results: US Renal  09/15/2013   CLINICAL DATA:  Acute renal failure.  Elevated creatinine.  EXAM: RENAL/URINARY TRACT ULTRASOUND COMPLETE  COMPARISON:  None.  FINDINGS: Right Kidney:  Length: 10.7 cm. Diffusely increased parenchymal echotexture with mild diffuse parenchymal atrophy. Changes are consistent with chronic medical renal  disease. Multiple small circumscribed low-attenuation lesions consistent with cysts. Largest is in the upper pole and measures about 1.7 cm maximal diameter. Small amount of fluid in the perirenal fat. No hydronephrosis.  Left Kidney:  Length: 12.2 cm. Diffusely increased parenchymal echotexture with diffuse parenchymal atrophy suggesting chronic medical renal disease. Small low-attenuation lesions in the kidney consistent with cysts. Largest is in the lower pole and measures about 2 cm maximal diameter. Small amount of fluid in the perirenal fat. No hydronephrosis.  Bladder:  Appears normal for degree of bladder distention.  IMPRESSION: Bilateral parenchymal changes consistent with chronic medical renal disease. Bilateral renal cysts. No hydronephrosis. Mild bilateral perinephric edema.   Electronically Signed   By: Lucienne Capers M.D.   On: 09/15/2013 22:04   . amLODipine  10 mg Oral Daily  . aspirin EC  81 mg Oral Daily  . clopidogrel  75 mg Oral Daily  . doxazosin  4 mg Oral Daily  . enoxaparin (LOVENOX) injection  30 mg Subcutaneous Q24H  . glipiZIDE  5 mg Oral BID AC  . insulin aspart  0-9 Units Subcutaneous TID WC  . insulin glargine  6 Units Subcutaneous QHS  . metoprolol tartrate  25 mg  Oral BID  . pneumococcal 23 valent vaccine  0.5 mL Intramuscular Tomorrow-1000  . sodium bicarbonate  650 mg Oral BID  . sodium polystyrene  30 g Oral Once    BMET    Component Value Date/Time   NA 145 09/16/2013 0334   K 5.6* 09/16/2013 0334   CL 111 09/16/2013 0334   CO2 21 09/16/2013 0334   GLUCOSE 94 09/16/2013 0334   BUN 53* 09/16/2013 0334   CREATININE 3.07* 09/16/2013 0334   CALCIUM 8.3* 09/16/2013 0334   GFRNONAA 19* 09/16/2013 0334   GFRAA 22* 09/16/2013 0334   CBC    Component Value Date/Time   WBC 5.8 09/15/2013 0738   RBC 2.94* 09/15/2013 0738   HGB 8.6* 09/15/2013 0738   HCT 26.1* 09/15/2013 0738   PLT 142* 09/15/2013 0738   MCV 88.8 09/15/2013 0738   MCH 29.3 09/15/2013 0738   MCHC 33.0 09/15/2013 0738    RDW 13.6 09/15/2013 0738     Assessment/Plan:  1 CKD- with RAS. Cr and BUN - No significant change, UA- with Large Hgb and RBCs, also with proteinuria- >300, serum albumin 3.2, without anarsaca.  No previous UA to establish temporal relationship of hematuria and proteinura. FeNa- 6.4, suggesting intrinsic renal pathology. Renal Uss- Chronic bilat renal dx, bilat renal cysts, no hydronephrosis, mild perinephric edema. Differential include- Superimposed glomerulonephritis, hematuria involving the urinary tract.  2. Hyperkalemia- 5.6 today. Has gotten one dose of Kayexalate. EKG without changes to suggests hyperkalemia. - Consider second dose of Kayexalate.  3. HTN- Cont home amlodipine and Metop, and doxazosin  4. DM  - SSI  5. RAS- Follow up after establishing kidney function/deterioration is not acute.Denton Brick, Rhona Leavens, PGY-2

## 2013-09-16 NOTE — Care Management Note (Unsigned)
    Page 1 of 1   09/16/2013     4:39:43 PM CARE MANAGEMENT NOTE 09/16/2013  Patient:  Robert Villanueva, Robert Villanueva   Account Number:  [id number]  Date Initiated:  09/16/2013  Documentation initiated by:  Calais Svehla  Subjective/Objective Assessment:   Pt adm on 09/13/13 with hyperkalemia, AKI prior to renal angiogram.  PTA, pt independent of ADLS.     Action/Plan:   Will follow for dc needs as pt progresses.   Anticipated DC Date:  09/18/2013   Anticipated DC Plan:  [place]  CM consult      Choice offered to / List presented to:             Status of service:  In process, will continue to follow Medicare Important Message given?   (If response is "NO", the following Medicare IM given date fields will be blank) Date Medicare IM given:   Medicare IM given by:   Date Additional Medicare IM given:   Additional Medicare IM given by:    Discharge Disposition:    Per UR Regulation:  Reviewed for med. necessity/level of care/duration of stay  If discussed at [place] of Stay Meetings, dates discussed:    Comments:

## 2013-09-16 NOTE — Progress Notes (Signed)
I have personally seen and examined this patient and agree with the assessment/plan as outlined above by Robert Brick MD (PGY2). Suspected AKI on CKD vs CKD progression (step-wise as seen in RAS). Concern raised with his perinephric edema (might be indicative of ATN versus GN) as well as hematuria-I will attempt to obtain records from outpatient urinalysis to see whether he has had hematuria in the past. (Which may indeed be from diabetes). Re-treat hyperkalemia with a second dose of Kayexalate today.  Robert Vandeusen K.,MD 09/16/2013 11:08 AM
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
## 2013-09-16 NOTE — Plan of Care (Signed)
Problem: Food- and Nutrition-Related Knowledge Deficit (NB-1.1) Goal: Nutrition education Formal process to instruct or train a patient/client in a skill or to impart knowledge to help patients/clients voluntarily manage or modify food choices and eating behavior to maintain or improve health. Outcome: Completed/Met Date Met:  09/16/13 Nutrition Education Note  RD consulted for Renal Education. Provided Choose-A-Meal Booklet to patient/family. Reviewed food groups and provided written recommended serving sizes specifically determined for patient's current nutritional status.   Explained why diet restrictions are needed and provided lists of foods to limit/avoid that are high potassium, sodium, and phosphorus. Provided specific recommendations on safer alternatives of these foods. Strongly encouraged compliance of this diet.   Discussed importance of protein intake at each meal and snack. Provided examples of how to maximize protein intake throughout the day. Discussed potential need for fluid restriction and renal-friendly beverage options.  Contact information provided. Teach back method used.  Expect poor compliance.  Pt has multiple barriers to following a Renal diet. Pt does not cook and does not plan to start. Pt eats daily at [facility]. Pt lives alone and does not have any help at home. Pt thankful for info and we identified a couple of changes he could make but he is very limited.   Body mass index is 29.44 kg/(m^2). Pt meets criteria for overweight based on current BMI.  Current diet order is Renal/CHO Modified, patient is consuming approximately 100% of meals at this time. Labs and medications reviewed. No further nutrition interventions warranted at this time. RD contact information provided. If additional nutrition issues arise, please re-consult RD.  [place], [place], [place] Pager [phone] After Hours Pager

## 2013-09-16 NOTE — H&P (Signed)
The patient was seen and examined. Agree with the above. Renal artery angiography with CO2 was planned. However, he had diarrhea last week and was found to have worsening renal function with hyperkalemia. He is also significantly anemia.  Procedure was thus canceled. Continue hydration. Consult nephrology. Work up for anemia.  Reschedule procedure for later. He can follow up with me in 1 month to re discuss. I might consider repeat renal artery duplex.

## 2013-09-17 DIAGNOSIS — E875 Hyperkalemia: Secondary | ICD-10-CM | POA: Diagnosis present

## 2013-09-17 DIAGNOSIS — N039 Chronic nephritic syndrome with unspecified morphologic changes: Secondary | ICD-10-CM

## 2013-09-17 DIAGNOSIS — N289 Disorder of kidney and ureter, unspecified: Secondary | ICD-10-CM

## 2013-09-17 DIAGNOSIS — D631 Anemia in chronic kidney disease: Secondary | ICD-10-CM

## 2013-09-17 DIAGNOSIS — Z89519 Acquired absence of unspecified leg below knee: Secondary | ICD-10-CM

## 2013-09-17 DIAGNOSIS — N179 Acute kidney failure, unspecified: Secondary | ICD-10-CM

## 2013-09-17 DIAGNOSIS — E1129 Type 2 diabetes mellitus with other diabetic kidney complication: Secondary | ICD-10-CM | POA: Diagnosis present

## 2013-09-17 DIAGNOSIS — N189 Chronic kidney disease, unspecified: Secondary | ICD-10-CM | POA: Diagnosis present

## 2013-09-17 LAB — BASIC METABOLIC PANEL
Anion gap: 15 (ref 5–15)
Anion gap: 16 — ABNORMAL HIGH (ref 5–15)
BUN: 48 mg/dL — AB (ref 6–23)
BUN: 49 mg/dL — AB (ref 6–23)
CALCIUM: 7.9 mg/dL — AB (ref 8.4–10.5)
CHLORIDE: 108 meq/L (ref 96–112)
CHLORIDE: 105 meq/L (ref 96–112)
CO2: 19 mEq/L (ref 19–32)
CO2: 21 meq/L (ref 19–32)
Calcium: 8.2 mg/dL — ABNORMAL LOW (ref 8.4–10.5)
Creatinine, Ser: 2.9 mg/dL — ABNORMAL HIGH (ref 0.50–1.35)
Creatinine, Ser: 2.9 mg/dL — ABNORMAL HIGH (ref 0.50–1.35)
GFR calc Af Amer: 24 mL/min — ABNORMAL LOW (ref >90)
GFR calc Af Amer: 24 mL/min — ABNORMAL LOW (ref >90)
GFR calc non Af Amer: 21 mL/min — ABNORMAL LOW (ref >90)
GFR, EST NON AFRICAN AMERICAN: 21 mL/min — AB (ref >90)
GLUCOSE: 123 mg/dL — AB (ref 70–99)
GLUCOSE: 97 mg/dL (ref 70–99)
Potassium: 4.7 mEq/L (ref 3.7–5.3)
Potassium: 4.6 mEq/L (ref 3.7–5.3)
SODIUM: 142 meq/L (ref 137–147)
Sodium: 142 mEq/L (ref 137–147)

## 2013-09-17 LAB — GLUCOSE, CAPILLARY
GLUCOSE-CAPILLARY: 93 mg/dL (ref 70–99)
Glucose-Capillary: 72 mg/dL (ref 70–99)
Glucose-Capillary: 70 mg/dL (ref 70–99)
Glucose-Capillary: 95 mg/dL (ref 70–99)

## 2013-09-17 LAB — C3 COMPLEMENT: C3 COMPLEMENT: 99 mg/dL (ref 90–180)

## 2013-09-17 LAB — MPO/PR-3 (ANCA) ANTIBODIES: Serine Protease 3: <1

## 2013-09-17 LAB — C4 COMPLEMENT: COMPLEMENT C4, BODY FLUID: 36 mg/dL (ref 10–40)

## 2013-09-17 MED ORDER — SODIUM BICARBONATE 650 MG PO TABS: 650.0000 mg | ORAL_TABLET | Freq: Two times a day (BID) | ORAL | Status: DC

## 2013-09-17 NOTE — Progress Notes (Signed)
IV and tele monitor d/c at this time; pt given d/c instructions at this time; pt verbalized understanding; pt states family will be here within the next 15 minutes.

## 2013-09-17 NOTE — Progress Notes (Signed)
S: no nausea, vomiting or SOB, no leg swelling.   O:BP 174/90  Pulse 61  Temp(Src) 97.8 F (36.6 C) (Oral)  Resp 18  Ht 6\' 1"  (1.854 m)  Wt 225 lb 3.2 oz (102.15 kg)  BMI 29.72 kg/m2  SpO2 99%  Intake/Output Summary (Last 24 hours) at 09/17/13 0809 Last data filed at 09/17/13 1751  Gross per 24 hour  Intake    720 ml  Output   1675 ml  Net   -955 ml   Intake/Output: I/O last 3 completed shifts: In: 1200 [P.O.:1200] Out: 3600 [Urine:3600]  Intake/Output this shift:    Weight change: 2 lb 1.6 oz (0.953 kg) Gen: NAD Heent: AT, [place], EOMI, mildly dry oral mucosa  CVS: Regular rate and rhythm, no added sounds Resp: Normal work of breathing.  Abd: Soft, non tender, bowel sounds present Ext: trace pitting pedal edema.   Recent Labs Lab 09/15/13 0738 09/15/13 1316 09/16/13 0334  NA 140 142 145  K 5.7* 6.0* 5.6*  CL 110 111 111  CO2 16* 18* 21  GLUCOSE 113* 111* 94  BUN 60* 58* 53*  CREATININE 3.24* 3.16* 3.07*  ALBUMIN  --   --  3.2*  CALCIUM 8.1* 8.3* 8.3*  AST  --   --  21  ALT  --   --  29   Liver Function Tests:  Recent Labs Lab 09/16/13 0334  AST 21  ALT 29  ALKPHOS 185*  BILITOT 0.3  PROT 6.1  ALBUMIN 3.2*   CBC:  Recent Labs Lab 09/15/13 0738  WBC 5.8  HGB 8.6*  HCT 26.1*  MCV 88.8  PLT 142*   CBG:  Recent Labs Lab 09/15/13 2201 09/16/13 0625 09/16/13 1105 09/16/13 1634 09/16/13 2109  GLUCAP 108* 96 129* 93 72    Iron Studies:   Recent Labs  09/15/13 1847  IRON 67  TIBC 224  FERRITIN 174   Studies/Results: US Renal  09/15/2013   CLINICAL DATA:  Acute renal failure.  Elevated creatinine.  EXAM: RENAL/URINARY TRACT ULTRASOUND COMPLETE  COMPARISON:  None.  FINDINGS: Right Kidney:  Length: 10.7 cm. Diffusely increased parenchymal echotexture with mild diffuse parenchymal atrophy. Changes are consistent with chronic medical renal disease. Multiple small circumscribed low-attenuation lesions consistent with cysts. Largest is in  the upper pole and measures about 1.7 cm maximal diameter. Small amount of fluid in the perirenal fat. No hydronephrosis.  Left Kidney:  Length: 12.2 cm. Diffusely increased parenchymal echotexture with diffuse parenchymal atrophy suggesting chronic medical renal disease. Small low-attenuation lesions in the kidney consistent with cysts. Largest is in the lower pole and measures about 2 cm maximal diameter. Small amount of fluid in the perirenal fat. No hydronephrosis.  Bladder:  Appears normal for degree of bladder distention.  IMPRESSION: Bilateral parenchymal changes consistent with chronic medical renal disease. Bilateral renal cysts. No hydronephrosis. Mild bilateral perinephric edema.   Electronically Signed   By: Lucienne Capers M.D.   On: 09/15/2013 22:04   . amLODipine  10 mg Oral Daily  . aspirin EC  81 mg Oral Daily  . clopidogrel  75 mg Oral Daily  . doxazosin  4 mg Oral Daily  . enoxaparin (LOVENOX) injection  30 mg Subcutaneous Q24H  . glipiZIDE  5 mg Oral BID AC  . insulin aspart  0-9 Units Subcutaneous TID WC  . insulin glargine  6 Units Subcutaneous QHS  . metoprolol tartrate  25 mg Oral BID  . sodium bicarbonate  650 mg Oral  BID    BMET    Component Value Date/Time   NA 145 09/16/2013 0334   K 5.6* 09/16/2013 0334   CL 111 09/16/2013 0334   CO2 21 09/16/2013 0334   GLUCOSE 94 09/16/2013 0334   BUN 53* 09/16/2013 0334   CREATININE 3.07* 09/16/2013 0334   CALCIUM 8.3* 09/16/2013 0334   GFRNONAA 19* 09/16/2013 0334   GFRAA 22* 09/16/2013 0334   CBC    Component Value Date/Time   WBC 5.8 09/15/2013 0738   RBC 2.94* 09/15/2013 0738   HGB 8.6* 09/15/2013 0738   HCT 26.1* 09/15/2013 0738   PLT 142* 09/15/2013 0738   MCV 88.8 09/15/2013 0738   MCH 29.3 09/15/2013 0738   MCHC 33.0 09/15/2013 0738   RDW 13.6 09/15/2013 0738     Assessment/Plan:  1 CKD- with RAS. Perinephric edema on renal USS indicative of possible ATN vs GN. Pt with hematuria and proteinuria also. Hematuria not a new finding,  present during out pt follow up visits. FeNa- 6.4, suggesting intrinsic renal pathology. ANCA in process. Renal Uss- Chronic bilat renal dx, bilat renal cysts, no hydronephrosis, mild perinephric edema. Consider DC home today, to day to follow up with cards (Dr Fletcher Anon in 2-3 wks, before revisiting plans for angiography) and f/u with nephrologist.  2. Hyperkalemia- Resolved. K- 4.7 today. Has gotten two doses of Kayexalate. EKG without changes to suggests hyperkalemia.  3. HTN- Still elevated, on home amlodipine and Metop, and doxazosin. Consider Oral hydralazine- 10mg  qid to start. Compliance will be an issue.   4. DM  - SSI   5. RAS- Cards following, appreciate input.Denton Brick, Rhona Leavens, PGY-2

## 2013-09-17 NOTE — Discharge Summary (Signed)
Patient ID: Robert Villanueva,  MRN: [id number], DOB/AGE: August 14, 1945 68 y.o.  Admit date: 09/15/2013 Discharge date: 09/17/2013  Primary cardiologist: Dr. Marlou Porch  PV Cardiologist: Dr. Fletcher Anon  Renal MD: Dr. Arty Baumgartner  Discharge Diagnoses Principal Problem:   Acute on chronic renal insufficiency Active Problems:   RAS- admitted for elective PTA but was found to be anemic with icreased SCr   Anemia   Chronic kidney disease, stage 4, severely decreased GFR   Cardiomyopathy, ischemic-EF 45%   Type 2 diabetes mellitus with renal manifestations   CAD- NSTEMI with RCA PTCA Dec 2014 in [place]   Hyperkalemia   S/P BKA (below knee amputation) Rt    Hospital Course:  This 68 y/o male was referred to Dr. Fletcher Anon for RAS assessment and treatment. A PV cath with possible renal artery stenting was planned on 07/08. On admission he renal function was noted to be worse than baseline and he was noted to be anemic. He was admitted and hydrated. Renal consult was obtained for acute on chronic renal insufficiency and anemia. He was noted to be hyperkalemic with K+ of 6 and this was treated by the nephrology service. His anemia was presumed to be secondary to chronic renal disease, I did send the pt home with a stool guaiac card. He will follow up with Dr Fletcher Anon in two weeks for rescheduling RA PTA.   Discharge Vitals:  Blood pressure 174/90, pulse 62, temperature 97.8 F (36.6 C), temperature source Oral, resp. rate 18, height 6\' 1"  (1.854 m), weight 225 lb 3.2 oz (102.15 kg), SpO2 99.00%.    Labs: Results for orders placed during the hospital encounter of 09/15/13 (from the past 24 hour(s))  C3 COMPLEMENT     Status: None   Collection Time    09/16/13 12:30 PM      Result Value Ref Range   C3 Complement 99  90 - 180 mg/dL  C4 COMPLEMENT     Status: None   Collection Time    09/16/13 12:30 PM      Result Value Ref Range   Complement C4, Body Fluid 36  10 - 40 mg/dL  MPO/PR-3 (ANCA) ANTIBODIES      Status: None   Collection Time    09/16/13 12:30 PM      Result Value Ref Range   Myeloperoxidase Abs <1.0  <1.0 AI   Serine Protease 3 <1.0  <1.0 AI  GLUCOSE, CAPILLARY     Status: None   Collection Time    09/16/13  4:34 PM      Result Value Ref Range   Glucose-Capillary 93  70 - 99 mg/dL   Comment 1 Notify RN    URINALYSIS, ROUTINE W REFLEX MICROSCOPIC     Status: Abnormal   Collection Time    09/16/13  6:58 PM      Result Value Ref Range   Color, Urine YELLOW  YELLOW   APPearance CLEAR  CLEAR   Specific Gravity, Urine 1.018  1.005 - 1.030   pH 5.5  5.0 - 8.0   Glucose, UA NEGATIVE  NEGATIVE mg/dL   Hgb urine dipstick LARGE (*) NEGATIVE   Bilirubin Urine NEGATIVE  NEGATIVE   Ketones, ur NEGATIVE  NEGATIVE mg/dL   Protein, ur >300 (*) NEGATIVE mg/dL   Urobilinogen, UA 0.2  0.0 - 1.0 mg/dL   Nitrite NEGATIVE  NEGATIVE   Leukocytes, UA NEGATIVE  NEGATIVE  URINE MICROSCOPIC-ADD ON     Status: Abnormal   Collection  Time    09/16/13  6:58 PM      Result Value Ref Range   Squamous Epithelial / LPF RARE  RARE   RBC / HPF 11-20  <3 RBC/hpf   Bacteria, UA RARE  RARE   Casts HYALINE CASTS (*) NEGATIVE  GLUCOSE, CAPILLARY     Status: None   Collection Time    09/16/13  9:09 PM      Result Value Ref Range   Glucose-Capillary 72  70 - 99 mg/dL   Comment 1 Notify RN    GLUCOSE, CAPILLARY     Status: None   Collection Time    09/17/13  6:29 AM      Result Value Ref Range   Glucose-Capillary 70  70 - 99 mg/dL   Comment 1 Notify RN    BASIC METABOLIC PANEL     Status: Abnormal   Collection Time    09/17/13  9:10 AM      Result Value Ref Range   Sodium 142  137 - 147 mEq/L   Potassium 4.7  3.7 - 5.3 mEq/L   Chloride 108  96 - 112 mEq/L   CO2 19  19 - 32 mEq/L   Glucose, Bld 123 (*) 70 - 99 mg/dL   BUN 48 (*) 6 - 23 mg/dL   Creatinine, Ser 2.90 (*) 0.50 - 1.35 mg/dL   Calcium 7.9 (*) 8.4 - 10.5 mg/dL   GFR calc non Af Amer 21 (*) >90 mL/min   GFR calc Af Amer 24 (*) >90  mL/min   Anion gap 15  5 - 15  BASIC METABOLIC PANEL     Status: Abnormal   Collection Time    09/17/13  9:33 AM      Result Value Ref Range   Sodium 142  137 - 147 mEq/L   Potassium 4.6  3.7 - 5.3 mEq/L   Chloride 105  96 - 112 mEq/L   CO2 21  19 - 32 mEq/L   Glucose, Bld 97  70 - 99 mg/dL   BUN 49 (*) 6 - 23 mg/dL   Creatinine, Ser 2.90 (*) 0.50 - 1.35 mg/dL   Calcium 8.2 (*) 8.4 - 10.5 mg/dL   GFR calc non Af Amer 21 (*) >90 mL/min   GFR calc Af Amer 24 (*) >90 mL/min   Anion gap 16 (*) 5 - 15  GLUCOSE, CAPILLARY     Status: None   Collection Time    09/17/13 11:04 AM      Result Value Ref Range   Glucose-Capillary 95  70 - 99 mg/dL    Disposition:      Follow-up Information   Follow up with [name], MD On 10/12/2013. (9:15 am)    Specialty:  Cardiology   Contact information:   [address] [address] [phone]       Follow up with Donetta Potts, MD. (call office for an appointment)    Specialty:  Nephrology   Contact information:   [place] [address] [phone]       Discharge Medications:    Medication List         amLODipine 10 MG tablet  Commonly known as:  NORVASC  Take 10 mg by mouth daily.     ASPIR-LOW 81 MG EC tablet  Generic drug:  aspirin  Take 81 mg by mouth daily.     clopidogrel 75 MG tablet  Commonly known as:  PLAVIX  Take 75 mg by mouth daily.     doxazosin 4 MG tablet  Commonly known as:  CARDURA  Take 4 mg by mouth daily.     ergocalciferol 50000 UNITS capsule  Commonly known as:  VITAMIN D2  Take 50,000 Units by mouth once a week.     glipiZIDE 5 MG tablet  Commonly known as:  GLUCOTROL  Take 5 mg by mouth 2 (two) times daily before a meal.     LANTUS 100 UNIT/ML injection  Generic drug:  insulin glargine  Inject 6 Units into the skin at bedtime.     metoprolol tartrate 25 MG tablet  Commonly known as:  LOPRESSOR  Take 25 mg by mouth 2 (two) times daily.       NITROSTAT 0.4 MG SL tablet  Generic drug:  nitroGLYCERIN  Place 0.4 mg under the tongue every 5 (five) minutes as needed for chest pain.     sodium bicarbonate 650 MG tablet  Take 1 tablet (650 mg total) by mouth 2 (two) times daily.         Duration of Discharge Encounter: Greater than 30 minutes including physician time.  Angelena Form PA-C 09/17/2013 12:09 PM

## 2013-09-17 NOTE — Progress Notes (Signed)
I have personally seen and examined this patient and agree with the assessment/plan as outlined above by Denton Brick MD (PGY2). Renal function appears to be improving with improvement of creatinine-no clear indication as to what caused his ATN type picture. Serologies negative or pending. Renal ultrasound without any obstruction. He appears to be stable for discharge and outpatient followup in 2-3 weeks with cardiology for consideration of renal artery angiography plus/minus stenting. Remains hypertensive and will possibly need up titration of doxazosin in the near future.  Reatha Sur K.,MD 09/17/2013 10:53 AM

## 2013-09-17 NOTE — Progress Notes (Signed)
Patient Name: Robert Villanueva Date of Encounter: 09/17/2013  Principal Problem:   RAS (renal artery stenosis) Active Problems:   Chronic kidney disease, stage 4, severely decreased GFR   Cardiomyopathy, ischemic   Anemia   Length of Stay: 2  SUBJECTIVE  Feels well. Labs pending  CURRENT MEDS . amLODipine  10 mg Oral Daily  . aspirin EC  81 mg Oral Daily  . clopidogrel  75 mg Oral Daily  . doxazosin  4 mg Oral Daily  . enoxaparin (LOVENOX) injection  30 mg Subcutaneous Q24H  . glipiZIDE  5 mg Oral BID AC  . insulin aspart  0-9 Units Subcutaneous TID WC  . insulin glargine  6 Units Subcutaneous QHS  . metoprolol tartrate  25 mg Oral BID  . sodium bicarbonate  650 mg Oral BID    OBJECTIVE   Intake/Output Summary (Last 24 hours) at 09/17/13 0942 Last data filed at 09/17/13 3329  Gross per 24 hour  Intake    720 ml  Output   1675 ml  Net   -955 ml   Filed Weights   09/15/13 0647 09/16/13 0341 09/17/13 0553  Weight: 215 lb (97.523 kg) 223 lb 1.6 oz (101.197 kg) 225 lb 3.2 oz (102.15 kg)    PHYSICAL EXAM Filed Vitals:   09/16/13 0341 09/16/13 1305 09/16/13 1959 09/17/13 0553  BP: 180/78 160/70 169/82 174/90  Pulse: 72 68 63 61  Temp: 98.1 F (36.7 C) 98 F (36.7 C) 98.2 F (36.8 C) 97.8 F (36.6 C)  TempSrc: Oral Oral Oral Oral  Resp: 20 18 18 18   Height:      Weight: 223 lb 1.6 oz (101.197 kg)   225 lb 3.2 oz (102.15 kg)  SpO2: 100% 100% 99% 99%   General: Well developed, well nourished WM in no acute distress.  Head: Normocephalic, atraumatic, sclera non-icteric, no xanthomas, nares are without discharge. L prosthetic eye noted  Neck: Negative for carotid bruits. JVP not elevated.  Lungs: Clear bilaterally to auscultation without wheezes, rales, or rhonchi. Breathing is unlabored.  Heart: RRR S1 S2 without murmurs, rubs, or gallops.  Abdomen: Soft, non-tender, non-distended with normoactive bowel sounds. No rebound/guarding.  Extremities: No clubbing or  cyanosis. No edema LLE. Distal pedal pulses are 2+ and equal bilaterally. S/p R BKA.  Neuro: Alert and oriented X 3. Moves all extremities spontaneously.  Psych: Responds to questions appropriately with a normal affect.  LABS  CBC  Recent Labs  09/15/13 0738  WBC 5.8  HGB 8.6*  HCT 26.1*  MCV 88.8  PLT 518*   Basic Metabolic Panel  Recent Labs  09/15/13 0738 09/15/13 1316 09/16/13 0334  NA 140 142 145  K 5.7* 6.0* 5.6*  CL 110 111 111  CO2 16* 18* 21  GLUCOSE 113* 111* 94  BUN 60* 58* 53*  CREATININE 3.24* 3.16* 3.07*  CALCIUM 8.1* 8.3* 8.3*  MG  --  1.5  --    Liver Function Tests  Recent Labs  09/16/13 0334  AST 21  ALT 29  ALKPHOS 185*  BILITOT 0.3  PROT 6.1  ALBUMIN 3.2*   Recent Labs  09/15/13 1316  HGBA1C 5.3    ASSESSMENT AND PLAN If K normal and creatinine still improving today, he is probably ready for DC. D/W Dr. Fletcher Anon, who would like to see him in the office in 2-3 weeks, before revisiting the plan for angiography. BP still high - further drug titration will be needed as an outpatient.  Lavanna Rog,  MD, [facility] [phone] office [phone] pager 09/17/2013 9:42 AM

## 2013-09-20 ENCOUNTER — Telehealth: Payer: Self-pay | Admitting: Cardiovascular Disease

## 2013-09-20 NOTE — Telephone Encounter (Signed)
Spoke with pt and advised him that Dr Fletcher Anon ordered for him to have a guaiac stool obtained at home and brought to the [facility] on [facility] across from [place] over instructions on how to obtain samples and where the location of the lab is.  Pt verbalized understanding and pleased with all the assistance provided.

## 2013-09-20 NOTE — Telephone Encounter (Signed)
New message     Pt got a hemacult card while in the hosp last week.  He does not know which doctor ordered the test and where to take the card when it is completed.  Can you help him?

## 2013-10-12 ENCOUNTER — Ambulatory Visit (INDEPENDENT_AMBULATORY_CARE_PROVIDER_SITE_OTHER): Payer: Commercial Managed Care - HMO | Admitting: Cardiovascular Disease

## 2013-10-12 ENCOUNTER — Encounter: Payer: Self-pay | Admitting: Cardiovascular Disease

## 2013-10-12 VITALS — BP 137/69 | HR 67 | Ht 73.0 in | Wt 227.0 lb

## 2013-10-12 DIAGNOSIS — Z01810 Encounter for preprocedural cardiovascular examination: Secondary | ICD-10-CM

## 2013-10-12 DIAGNOSIS — I739 Peripheral vascular disease, unspecified: Secondary | ICD-10-CM

## 2013-10-12 DIAGNOSIS — I25118 Atherosclerotic heart disease of native coronary artery with other forms of angina pectoris: Secondary | ICD-10-CM

## 2013-10-12 DIAGNOSIS — I701 Atherosclerosis of renal artery: Secondary | ICD-10-CM

## 2013-10-12 DIAGNOSIS — I209 Angina pectoris, unspecified: Secondary | ICD-10-CM

## 2013-10-12 DIAGNOSIS — I2510 Atherosclerotic heart disease of native coronary artery without angina pectoris: Secondary | ICD-10-CM

## 2013-10-12 LAB — CBC
HCT: 25 % — ABNORMAL LOW (ref 39.0–52.0)
HEMOGLOBIN: 8.5 g/dL — AB (ref 13.0–17.0)
MCHC: 33.9 g/dL (ref 30.0–36.0)
MCV: 88.8 fl (ref 78.0–100.0)
PLATELETS: 128 10*3/uL — AB (ref 150.0–400.0)
RBC: 2.82 Mil/uL — ABNORMAL LOW (ref 4.22–5.81)
RDW: 13.5 % (ref 11.5–15.5)
WBC: 5.7 10*3/uL (ref 4.0–10.5)

## 2013-10-12 LAB — PROTIME-INR
INR: 1 ratio (ref 0.8–1.0)
Prothrombin Time: 10.9 s (ref 9.6–13.1)

## 2013-10-12 LAB — BASIC METABOLIC PANEL
BUN: 65 mg/dL — ABNORMAL HIGH (ref 6–23)
CO2: 20 meq/L (ref 19–32)
Calcium: 8.2 mg/dL — ABNORMAL LOW (ref 8.4–10.5)
Chloride: 110 mEq/L (ref 96–112)
Creatinine, Ser: 3.3 mg/dL — ABNORMAL HIGH (ref 0.4–1.5)
GFR: 19.91 mL/min — ABNORMAL LOW (ref >60.00)
GLUCOSE: 167 mg/dL — AB (ref 70–99)
POTASSIUM: 4.1 meq/L (ref 3.5–5.1)
SODIUM: 139 meq/L (ref 135–145)

## 2013-10-12 NOTE — Patient Instructions (Addendum)
Your physician has requested that you have a peripheral vascular angiogram. This exam is performed at the hospital. During this exam IV contrast is used to look at arterial blood flow. Please review the information sheet given for details.  Your physician recommends that you continue on your current medications as directed. Please refer to the Current Medication list given to you today.  Your physician recommends that you have lab work today: BMP, CBC and PT/INR  

## 2013-10-12 NOTE — Assessment & Plan Note (Signed)
The patient possibly has significant right renal artery stenosis. If that's confirmed by angiography, then renal artery stenting would be indicated for renal preservation given the degree of chronic kidney disease. Right renal size was normal by ultrasound. Obviously, the other etiologies for chronic kidney disease could be due to his underlying medical conditions including prolonged diabetes and hypertension. Renal artery revascularization would only be indicated if there is high-grade stenosis. I discussed with the patient the risks and benefits of angiography. There is possibility of no improvement even with revascularization if his chronic kidney disease is related to medical disease. There is also potential for complications and possibility of worsening renal function. The patient understands all of the above and wants to proceed.
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
## 2013-10-12 NOTE — Assessment & Plan Note (Signed)
He has no symptoms of angina. Continue medical therapy. 

## 2013-10-13 ENCOUNTER — Encounter (HOSPITAL_COMMUNITY): Payer: Self-pay

## 2013-10-15 ENCOUNTER — Other Ambulatory Visit (HOSPITAL_COMMUNITY): Payer: Self-pay | Admitting: *Deleted

## 2013-10-18 ENCOUNTER — Inpatient Hospital Stay (HOSPITAL_COMMUNITY): Admission: RE | Admit: 2013-10-18 | Payer: Commercial Managed Care - HMO | Source: Ambulatory Visit

## 2013-10-20 ENCOUNTER — Ambulatory Visit (HOSPITAL_COMMUNITY)
Admission: RE | Admit: 2013-10-20 | Discharge: 2013-10-20 | Disposition: A | Discharge status: Home / Self Care | Payer: Medicare HMO | Source: Ambulatory Visit | Attending: Cardiovascular Disease | Admitting: Cardiovascular Disease

## 2013-10-20 ENCOUNTER — Ambulatory Visit (HOSPITAL_COMMUNITY)
Admission: RE | Admit: 2013-10-20 | Discharge: 2013-10-20 | Disposition: A | Discharge status: Home / Self Care | Payer: Medicare HMO | Source: Ambulatory Visit | Attending: Nephrology | Admitting: Nephrology

## 2013-10-20 ENCOUNTER — Encounter (HOSPITAL_COMMUNITY)
Admission: RE | Disposition: A | Discharge status: Home / Self Care | Payer: Self-pay | Source: Ambulatory Visit | Attending: Cardiovascular Disease

## 2013-10-20 DIAGNOSIS — I252 Old myocardial infarction: Secondary | ICD-10-CM | POA: Diagnosis not present

## 2013-10-20 DIAGNOSIS — Z7902 Long term (current) use of antithrombotics/antiplatelets: Secondary | ICD-10-CM | POA: Insufficient documentation

## 2013-10-20 DIAGNOSIS — I509 Heart failure, unspecified: Secondary | ICD-10-CM | POA: Insufficient documentation

## 2013-10-20 DIAGNOSIS — E119 Type 2 diabetes mellitus without complications: Secondary | ICD-10-CM | POA: Insufficient documentation

## 2013-10-20 DIAGNOSIS — I129 Hypertensive chronic kidney disease with stage 1 through stage 4 chronic kidney disease, or unspecified chronic kidney disease: Secondary | ICD-10-CM | POA: Diagnosis not present

## 2013-10-20 DIAGNOSIS — E785 Hyperlipidemia, unspecified: Secondary | ICD-10-CM | POA: Insufficient documentation

## 2013-10-20 DIAGNOSIS — Z7982 Long term (current) use of aspirin: Secondary | ICD-10-CM | POA: Diagnosis not present

## 2013-10-20 DIAGNOSIS — D649 Anemia, unspecified: Secondary | ICD-10-CM | POA: Insufficient documentation

## 2013-10-20 DIAGNOSIS — I2510 Atherosclerotic heart disease of native coronary artery without angina pectoris: Secondary | ICD-10-CM | POA: Insufficient documentation

## 2013-10-20 DIAGNOSIS — N059 Unspecified nephritic syndrome with unspecified morphologic changes: Secondary | ICD-10-CM | POA: Insufficient documentation

## 2013-10-20 DIAGNOSIS — I701 Atherosclerosis of renal artery: Secondary | ICD-10-CM

## 2013-10-20 DIAGNOSIS — N189 Chronic kidney disease, unspecified: Secondary | ICD-10-CM | POA: Insufficient documentation

## 2013-10-20 DIAGNOSIS — Z794 Long term (current) use of insulin: Secondary | ICD-10-CM | POA: Diagnosis not present

## 2013-10-20 DIAGNOSIS — Z9861 Coronary angioplasty status: Secondary | ICD-10-CM | POA: Insufficient documentation

## 2013-10-20 HISTORY — PX: RENAL ANGIOGRAM: SHX5509

## 2013-10-20 LAB — POCT HEMOGLOBIN-HEMACUE: Hemoglobin: 8.5 g/dL — ABNORMAL LOW (ref 13.0–17.0)

## 2013-10-20 LAB — GLUCOSE, CAPILLARY
GLUCOSE-CAPILLARY: 91 mg/dL (ref 70–99)
GLUCOSE-CAPILLARY: 102 mg/dL — AB (ref 70–99)

## 2013-10-20 SURGERY — RENAL ANGIOGRAM
Anesthesia: LOCAL

## 2013-10-20 MED ORDER — LIDOCAINE HCL (PF) 1 % IJ SOLN
INTRAMUSCULAR | Status: AC
  Filled 2013-10-20: qty 30

## 2013-10-20 MED ORDER — HEPARIN (PORCINE) IN NACL 2-0.9 UNIT/ML-% IJ SOLN
INTRAMUSCULAR | Status: AC
  Filled 2013-10-20: qty 1000

## 2013-10-20 MED ORDER — ASPIRIN 81 MG PO CHEW: 81.0000 mg | CHEWABLE_TABLET | ORAL | Status: DC

## 2013-10-20 MED ORDER — SODIUM CHLORIDE 0.9 % IJ SOLN: 3.0000 mL | INTRAMUSCULAR | Status: DC | PRN

## 2013-10-20 MED ORDER — SODIUM CHLORIDE 0.9 % IV SOLN
1020.0000 mg | Freq: Once | INTRAVENOUS | Status: AC
  Administered 2013-10-20: 1020 mg via INTRAVENOUS
  Filled 2013-10-20: qty 34

## 2013-10-20 MED ORDER — DARBEPOETIN ALFA-POLYSORBATE 60 MCG/0.3ML IJ SOLN: 60.0000 ug | INTRAMUSCULAR | Status: DC

## 2013-10-20 MED ORDER — SODIUM CHLORIDE 0.9 % IJ SOLN: 3.0000 mL | Freq: Two times a day (BID) | INTRAMUSCULAR | Status: DC

## 2013-10-20 MED ORDER — SODIUM CHLORIDE 0.9 % IV SOLN: INTRAVENOUS | Status: AC

## 2013-10-20 MED ORDER — SODIUM CHLORIDE 0.9 % IV SOLN
INTRAVENOUS | Status: DC
  Administered 2013-10-20: 08:00:00 via INTRAVENOUS

## 2013-10-20 MED ORDER — SODIUM CHLORIDE 0.9 % IV SOLN: 250.0000 mL | INTRAVENOUS | Status: DC | PRN

## 2013-10-20 MED ORDER — DARBEPOETIN ALFA-POLYSORBATE 60 MCG/0.3ML IJ SOLN
INTRAMUSCULAR | Status: AC
  Administered 2013-10-20: 60 ug
  Filled 2013-10-20: qty 0.3
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
## 2013-10-20 NOTE — Progress Notes (Signed)
Site area:right groin  Site Prior to Removal:  Level0  Pressure Applied For 68 MINUTES    Minutes Beginning at 1134 Manual:    yes  Patient Status During Pull:  stable  Post Pull Groin Site:  Level  0  Post Pull Instructions Given:   yes  Post Pull Pulses Present:   NA, Rt BKA  Dressing Applied:   yes   Bedrest Begins: 1200  Comments:  No complications

## 2013-10-20 NOTE — Discharge Instructions (Signed)

## 2013-10-20 NOTE — CV Procedure (Signed)
    PERIPHERAL VASCULAR PROCEDURE  NAME:  Robert Villanueva   MRN: [id number] DOB:  October 24, 1945   ADMIT DATE: 10/20/2013  Performing Cardiologist: [name] Primary Physician: Nena Jordan, Mammie Lorenzo, MD   Procedures Performed:  Abdominal Aortic Angiogram   nonselective renal angiography with CO2.   Indication(s):    suspected renal artery stenosis with chronic kidney disease.   Consent: The procedure with Risks/Benefits/Alternatives and Indications was reviewed with the patient .  All questions were answered.  Medications:  No sedation. No contrast used.   Procedural details: The right groin was prepped, draped, and anesthetized with 1% lidocaine. Using modified Seldinger technique, a 6 French sheath was introduced into the right common femoral artery. A 5 Fr Short Pigtail Catheter was advanced of over a  Versicore wire into the descending Aorta to a level just above the renal arteries.  Nonselective renal angiography was performed with CO2 in the AP position with magnified views.   Hemodynamics:  Central Aortic Pressure / Mean Aortic Pressure: 159/60  Findings:  Abdominal aorta: Normal in size with no evidence of significant atherosclerosis or aneurysm.   Left renal artery: Normal   Right renal artery: There seems to be 2 right renal arteries. Both of them are medium in size with no significant disease.    Celiac artery:   Normal   Superior mesenteric artery:  normal   Right common iliac artery:  normal   Left common iliac artery:   normal   Conclusions: No evidence of renal artery stenosis.   Recommendations:   continue medical therapy. Chronic kidney disease is not due to ischemic nephropathy    [name], MD, [facility] 10/20/2013 10:53 AM

## 2013-10-20 NOTE — Discharge Instructions (Signed)
Darbepoetin Alfa injection What is this medicine? DARBEPOETIN ALFA (dar be POE e tin AL fa) helps your body make more red blood cells. It is used to treat anemia caused by chronic kidney failure and chemotherapy. This medicine may be used for other purposes; ask your health care provider or pharmacist if you have questions. COMMON BRAND NAME(S): Aranesp What should I tell my health care provider before I take this medicine? They need to know if you have any of these conditions: -blood clotting disorders or history of blood clots -cancer patient not on chemotherapy -cystic fibrosis -heart disease, such as angina, heart failure, or a history of a heart attack -hemoglobin level of 12 g/dL or greater -high blood pressure -low levels of folate, iron, or vitamin B12 -seizures -an unusual or allergic reaction to darbepoetin, erythropoietin, albumin, hamster proteins, latex, other medicines, foods, dyes, or preservatives -pregnant or trying to get pregnant -breast-feeding How should I use this medicine? This medicine is for injection into a vein or under the skin. It is usually given by a health care professional in a hospital or clinic setting. If you get this medicine at home, you will be taught how to prepare and give this medicine. Do not shake the solution before you withdraw a dose. Use exactly as directed. Take your medicine at regular intervals. Do not take your medicine more often than directed. It is important that you put your used needles and syringes in a special sharps container. Do not put them in a trash can. If you do not have a sharps container, call your pharmacist or healthcare provider to get one. Talk to your pediatrician regarding the use of this medicine in children. While this medicine may be used in children as young as 1 year for selected conditions, precautions do apply. Overdosage: If you think you have taken too much of this medicine contact a poison control center or  emergency room at once. NOTE: This medicine is only for you. Do not share this medicine with others. What if I miss a dose? If you miss a dose, take it as soon as you can. If it is almost time for your next dose, take only that dose. Do not take double or extra doses. What may interact with this medicine? Do not take this medicine with any of the following medications: -epoetin alfa This list may not describe all possible interactions. Give your health care provider a list of all the medicines, herbs, non-prescription drugs, or dietary supplements you use. Also tell them if you smoke, drink alcohol, or use illegal drugs. Some items may interact with your medicine. What should I watch for while using this medicine? Visit your prescriber or health care professional for regular checks on your progress and for the needed blood tests and blood pressure measurements. It is especially important for the doctor to make sure your hemoglobin level is in the desired range, to limit the risk of potential side effects and to give you the best benefit. Keep all appointments for any recommended tests. Check your blood pressure as directed. Ask your doctor what your blood pressure should be and when you should contact him or her. As your body makes more red blood cells, you may need to take iron, folic acid, or vitamin B supplements. Ask your doctor or health care provider which products are right for you. If you have kidney disease continue dietary restrictions, even though this medication can make you feel better. Talk with your doctor or health   care professional about the foods you eat and the vitamins that you take. What side effects may I notice from receiving this medicine? Side effects that you should report to your doctor or health care professional as soon as possible: -allergic reactions like skin rash, itching or hives, swelling of the face, lips, or tongue -breathing problems -changes in vision -chest  pain -confusion, trouble speaking or understanding -feeling faint or lightheaded, falls -high blood pressure -muscle aches or pains -pain, swelling, warmth in the leg -rapid weight gain -severe headaches -sudden numbness or weakness of the face, arm or leg -trouble walking, dizziness, loss of balance or coordination -seizures (convulsions) -swelling of the ankles, feet, hands -unusually weak or tired Side effects that usually do not require medical attention (report to your doctor or health care professional if they continue or are bothersome): -diarrhea -fever, chills (flu-like symptoms) -headaches -nausea, vomiting -redness, stinging, or swelling at site where injected This list may not describe all possible side effects. Call your doctor for medical advice about side effects. You may report side effects to FDA at 1-800-FDA-1088. Where should I keep my medicine? Keep out of the reach of children. Store in a refrigerator between 2 and 8 degrees C (36 and 46 degrees F). Do not freeze. Do not shake. Throw away any unused portion if using a single-dose vial. Throw away any unused medicine after the expiration date. NOTE: This sheet is a summary. It may not cover all possible information. If you have questions about this medicine, talk to your doctor, pharmacist, or health care provider.  2015, Elsevier/Gold Standard. (2008-02-09 10:23:57)  Ferumoxytol injection What is this medicine? FERUMOXYTOL is an iron complex. Iron is used to make healthy red blood cells, which carry oxygen and nutrients throughout the body. This medicine is used to treat iron deficiency anemia in people with chronic kidney disease. This medicine may be used for other purposes; ask your health care provider or pharmacist if you have questions. COMMON BRAND NAME(S): Feraheme What should I tell my health care provider before I take this medicine? They need to know if you have any of these conditions: -anemia not  caused by low iron levels -high levels of iron in the blood -magnetic resonance imaging (MRI) test scheduled -an unusual or allergic reaction to iron, other medicines, foods, dyes, or preservatives -pregnant or trying to get pregnant -breast-feeding How should I use this medicine? This medicine is for injection into a vein. It is given by a health care professional in a hospital or clinic setting. Talk to your pediatrician regarding the use of this medicine in children. Special care may be needed. Overdosage: If you think you've taken too much of this medicine contact a poison control center or emergency room at once. Overdosage: If you think you have taken too much of this medicine contact a poison control center or emergency room at once. NOTE: This medicine is only for you. Do not share this medicine with others. What if I miss a dose? It is important not to miss your dose. Call your doctor or health care professional if you are unable to keep an appointment. What may interact with this medicine? This medicine may interact with the following medications: -other iron products This list may not describe all possible interactions. Give your health care provider a list of all the medicines, herbs, non-prescription drugs, or dietary supplements you use. Also tell them if you smoke, drink alcohol, or use illegal drugs. Some items may interact with your   medicine. What should I watch for while using this medicine? Visit your doctor or healthcare professional regularly. Tell your doctor or healthcare professional if your symptoms do not start to get better or if they get worse. You may need blood work done while you are taking this medicine. You may need to follow a special diet. Talk to your doctor. Foods that contain iron include: whole grains/cereals, dried fruits, beans, or peas, leafy green vegetables, and organ meats (liver, kidney). What side effects may I notice from receiving this  medicine? Side effects that you should report to your doctor or health care professional as soon as possible: -allergic reactions like skin rash, itching or hives, swelling of the face, lips, or tongue -breathing problems -changes in blood pressure -feeling faint or lightheaded, falls -fever or chills -flushing, sweating, or hot feelings -swelling of the ankles or feet Side effects that usually do not require medical attention (Report these to your doctor or health care professional if they continue or are bothersome.): -diarrhea -headache -nausea, vomiting -stomach pain This list may not describe all possible side effects. Call your doctor for medical advice about side effects. You may report side effects to FDA at 1-800-FDA-1088. Where should I keep my medicine? This drug is given in a hospital or clinic and will not be stored at home. NOTE: This sheet is a summary. It may not cover all possible information. If you have questions about this medicine, talk to your doctor, pharmacist, or health care provider.  2015, Elsevier/Gold Standard. (2011-10-11 15:23:36)  

## 2013-10-20 NOTE — Interval H&P Note (Signed)
History and Physical Interval Note:  10/20/2013 10:32 AM  Robert Villanueva  has presented today for surgery, with the diagnosis of renal artery stenosis  The various methods of treatment have been discussed with the patient and family. After consideration of risks, benefits and other options for treatment, the patient has consented to  Procedure(s): RENAL ANGIOGRAM (N/A) as a surgical intervention .  The patient's history has been reviewed, patient examined, no change in status, stable for surgery.  I have reviewed the patient's chart and labs.  Questions were answered to the patient's satisfaction.     [name]

## 2013-10-22 ENCOUNTER — Encounter (HOSPITAL_COMMUNITY): Payer: Commercial Managed Care - HMO

## 2013-11-03 ENCOUNTER — Encounter (HOSPITAL_COMMUNITY)
Admit: 2013-11-03 | Discharge: 2013-11-03 | Disposition: A | Discharge status: Home / Self Care | Payer: Medicare HMO | Source: Ambulatory Visit | Attending: Nephrology | Admitting: Nephrology

## 2013-11-03 DIAGNOSIS — I129 Hypertensive chronic kidney disease with stage 1 through stage 4 chronic kidney disease, or unspecified chronic kidney disease: Secondary | ICD-10-CM | POA: Diagnosis present

## 2013-11-03 DIAGNOSIS — N039 Chronic nephritic syndrome with unspecified morphologic changes: Principal | ICD-10-CM

## 2013-11-03 DIAGNOSIS — D631 Anemia in chronic kidney disease: Secondary | ICD-10-CM | POA: Insufficient documentation

## 2013-11-03 DIAGNOSIS — N184 Chronic kidney disease, stage 4 (severe): Secondary | ICD-10-CM | POA: Insufficient documentation

## 2013-11-03 LAB — POCT HEMOGLOBIN-HEMACUE: Hemoglobin: 10.9 g/dL — ABNORMAL LOW (ref 13.0–17.0)

## 2013-11-03 MED ORDER — DARBEPOETIN ALFA-POLYSORBATE 60 MCG/0.3ML IJ SOLN
60.0000 ug | INTRAMUSCULAR | Status: DC
  Administered 2013-11-03: 60 ug via SUBCUTANEOUS

## 2013-11-03 MED ORDER — DARBEPOETIN ALFA-POLYSORBATE 60 MCG/0.3ML IJ SOLN
INTRAMUSCULAR | Status: AC
  Filled 2013-11-03: qty 0.3

## 2013-11-08 ENCOUNTER — Encounter: Payer: Self-pay | Admitting: Physician Assistant

## 2013-11-08 ENCOUNTER — Ambulatory Visit (INDEPENDENT_AMBULATORY_CARE_PROVIDER_SITE_OTHER): Payer: Commercial Managed Care - HMO | Admitting: Physician Assistant

## 2013-11-08 VITALS — BP 130/60 | HR 55 | Ht 73.0 in | Wt 218.0 lb

## 2013-11-08 DIAGNOSIS — R0989 Other specified symptoms and signs involving the circulatory and respiratory systems: Secondary | ICD-10-CM

## 2013-11-08 DIAGNOSIS — I255 Ischemic cardiomyopathy: Secondary | ICD-10-CM

## 2013-11-08 DIAGNOSIS — N184 Chronic kidney disease, stage 4 (severe): Secondary | ICD-10-CM

## 2013-11-08 DIAGNOSIS — I2589 Other forms of chronic ischemic heart disease: Secondary | ICD-10-CM

## 2013-11-08 DIAGNOSIS — I10 Essential (primary) hypertension: Secondary | ICD-10-CM

## 2013-11-08 DIAGNOSIS — E875 Hyperkalemia: Secondary | ICD-10-CM

## 2013-11-08 DIAGNOSIS — I2510 Atherosclerotic heart disease of native coronary artery without angina pectoris: Secondary | ICD-10-CM

## 2013-11-08 LAB — BASIC METABOLIC PANEL
BUN: 62 mg/dL — ABNORMAL HIGH (ref 6–23)
CALCIUM: 8.6 mg/dL (ref 8.4–10.5)
CO2: 22 mEq/L (ref 19–32)
CREATININE: 3.7 mg/dL — AB (ref 0.4–1.5)
Chloride: 111 mEq/L (ref 96–112)
GFR: 17.33 mL/min — AB (ref >60.00)
GLUCOSE: 111 mg/dL — AB (ref 70–99)
Potassium: 5 mEq/L (ref 3.5–5.1)
Sodium: 139 mEq/L (ref 135–145)
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
## 2013-11-08 NOTE — Patient Instructions (Addendum)
LAB WORK TODAY; BMET  Your physician has requested that you have a lower extremity arterial PSEUDOANEURYSM; RIGHT GROIN BRUIT, R/O PSEUDOANEURYSM  . This test is an ultrasound of the arteries in the legs or arms. It looks at arterial blood flow in the legs and arms. Allow one hour for Lower and Upper Arterial scans. There are no restrictions or special instructions   Your physician recommends that you schedule a follow-up appointment in: 3-4 MONTHS WITH DR. Marlou Porch

## 2013-11-09 ENCOUNTER — Telehealth: Payer: Self-pay | Admitting: *Deleted

## 2013-11-09 NOTE — Telephone Encounter (Signed)
pt notified about lab results with verbal understanding. Pt aware I will fax labs to Dr. Regis Bill for him today.

## 2013-11-11 ENCOUNTER — Ambulatory Visit (HOSPITAL_COMMUNITY): Payer: Medicare HMO | Attending: Cardiology | Admitting: Cardiology

## 2013-11-11 DIAGNOSIS — E119 Type 2 diabetes mellitus without complications: Secondary | ICD-10-CM | POA: Insufficient documentation

## 2013-11-11 DIAGNOSIS — R0989 Other specified symptoms and signs involving the circulatory and respiratory systems: Secondary | ICD-10-CM | POA: Insufficient documentation

## 2013-11-11 DIAGNOSIS — I2510 Atherosclerotic heart disease of native coronary artery without angina pectoris: Secondary | ICD-10-CM | POA: Insufficient documentation

## 2013-11-11 DIAGNOSIS — E785 Hyperlipidemia, unspecified: Secondary | ICD-10-CM | POA: Insufficient documentation

## 2013-11-11 DIAGNOSIS — I10 Essential (primary) hypertension: Secondary | ICD-10-CM | POA: Insufficient documentation

## 2013-11-11 NOTE — Progress Notes (Signed)
Right lower arterial duplex performed

## 2013-11-16 ENCOUNTER — Telehealth: Payer: Self-pay | Admitting: *Deleted

## 2013-11-16 ENCOUNTER — Encounter (HOSPITAL_COMMUNITY)
Admission: RE | Admit: 2013-11-16 | Discharge: 2013-11-16 | Disposition: A | Discharge status: Home / Self Care | Payer: Medicare HMO | Source: Ambulatory Visit | Attending: Nephrology | Admitting: Nephrology

## 2013-11-16 DIAGNOSIS — D631 Anemia in chronic kidney disease: Secondary | ICD-10-CM | POA: Insufficient documentation

## 2013-11-16 DIAGNOSIS — N039 Chronic nephritic syndrome with unspecified morphologic changes: Principal | ICD-10-CM

## 2013-11-16 DIAGNOSIS — N184 Chronic kidney disease, stage 4 (severe): Secondary | ICD-10-CM | POA: Insufficient documentation

## 2013-11-16 DIAGNOSIS — I129 Hypertensive chronic kidney disease with stage 1 through stage 4 chronic kidney disease, or unspecified chronic kidney disease: Secondary | ICD-10-CM | POA: Insufficient documentation

## 2013-11-16 LAB — CBC
HEMATOCRIT: 34 % — AB (ref 39.0–52.0)
Hemoglobin: 11.5 g/dL — ABNORMAL LOW (ref 13.0–17.0)
MCH: 30.5 pg (ref 26.0–34.0)
MCHC: 33.8 g/dL (ref 30.0–36.0)
MCV: 90.2 fL (ref 78.0–100.0)
PLATELETS: 127 10*3/uL — AB (ref 150–400)
RBC: 3.77 MIL/uL — AB (ref 4.22–5.81)
RDW: 13.6 % (ref 11.5–15.5)
WBC: 4.6 10*3/uL (ref 4.0–10.5)

## 2013-11-16 MED ORDER — DARBEPOETIN ALFA-POLYSORBATE 60 MCG/0.3ML IJ SOLN
INTRAMUSCULAR | Status: AC
  Filled 2013-11-16: qty 0.3

## 2013-11-16 MED ORDER — DARBEPOETIN ALFA-POLYSORBATE 60 MCG/0.3ML IJ SOLN
60.0000 ug | INTRAMUSCULAR | Status: DC
  Administered 2013-11-16: 60 ug via SUBCUTANEOUS

## 2013-11-16 NOTE — Telephone Encounter (Signed)
pt notified about LOWER EXTREMITY ARTERIAL PSEUDOANEURYSM results, no pseudoaneurysm or AV fistula. Pt verbalized understanding to results given today and said thank you.

## 2013-11-17 ENCOUNTER — Encounter (HOSPITAL_COMMUNITY): Payer: Commercial Managed Care - HMO

## 2013-11-29 NOTE — Progress Notes (Signed)
This encounter was created in error - please disregard.

## 2013-11-30 ENCOUNTER — Encounter (HOSPITAL_COMMUNITY): Payer: Commercial Managed Care - HMO

## 2013-12-01 ENCOUNTER — Encounter (HOSPITAL_COMMUNITY)
Admission: RE | Admit: 2013-12-01 | Discharge: 2013-12-01 | Disposition: A | Discharge status: Home / Self Care | Payer: Medicare HMO | Source: Ambulatory Visit | Attending: Nephrology | Admitting: Nephrology

## 2013-12-01 DIAGNOSIS — I129 Hypertensive chronic kidney disease with stage 1 through stage 4 chronic kidney disease, or unspecified chronic kidney disease: Secondary | ICD-10-CM | POA: Insufficient documentation

## 2013-12-01 DIAGNOSIS — N184 Chronic kidney disease, stage 4 (severe): Secondary | ICD-10-CM | POA: Insufficient documentation

## 2013-12-01 DIAGNOSIS — D631 Anemia in chronic kidney disease: Secondary | ICD-10-CM | POA: Diagnosis not present

## 2013-12-01 DIAGNOSIS — N039 Chronic nephritic syndrome with unspecified morphologic changes: Principal | ICD-10-CM

## 2013-12-01 LAB — IRON AND TIBC
Iron: 71 ug/dL (ref 42–135)
SATURATION RATIOS: 30 % (ref 20–55)
TIBC: 237 ug/dL (ref 215–435)
UIBC: 166 ug/dL (ref 125–400)

## 2013-12-01 LAB — POCT HEMOGLOBIN-HEMACUE: Hemoglobin: 12.5 g/dL — ABNORMAL LOW (ref 13.0–17.0)

## 2013-12-01 LAB — FERRITIN: Ferritin: 217 ng/mL (ref 22–322)

## 2013-12-01 MED ORDER — DARBEPOETIN ALFA-POLYSORBATE 60 MCG/0.3ML IJ SOLN: 60.0000 ug | INTRAMUSCULAR | Status: DC

## 2013-12-15 ENCOUNTER — Encounter (HOSPITAL_COMMUNITY)
Admission: RE | Admit: 2013-12-15 | Discharge: 2013-12-15 | Disposition: A | Discharge status: Home / Self Care | Payer: Commercial Managed Care - HMO | Source: Ambulatory Visit | Attending: Nephrology | Admitting: Nephrology

## 2013-12-15 DIAGNOSIS — D509 Iron deficiency anemia, unspecified: Secondary | ICD-10-CM | POA: Diagnosis present

## 2013-12-15 LAB — CBC
HCT: 32.9 % — ABNORMAL LOW (ref 39.0–52.0)
Hemoglobin: 11.6 g/dL — ABNORMAL LOW (ref 13.0–17.0)
MCH: 30.7 pg (ref 26.0–34.0)
MCHC: 35.3 g/dL (ref 30.0–36.0)
MCV: 87 fL (ref 78.0–100.0)
Platelets: 112 10*3/uL — ABNORMAL LOW (ref 150–400)
RBC: 3.78 MIL/uL — ABNORMAL LOW (ref 4.22–5.81)
RDW: 12 % (ref 11.5–15.5)
WBC: 4.6 10*3/uL (ref 4.0–10.5)

## 2013-12-15 MED ORDER — DARBEPOETIN ALFA-POLYSORBATE 60 MCG/0.3ML IJ SOLN: 60.0000 ug | INTRAMUSCULAR | Status: DC

## 2013-12-15 MED ORDER — DARBEPOETIN ALFA-POLYSORBATE 60 MCG/0.3ML IJ SOLN
INTRAMUSCULAR | Status: AC
  Administered 2013-12-15: 60 ug via SUBCUTANEOUS
  Filled 2013-12-15: qty 0.3

## 2013-12-16 ENCOUNTER — Encounter: Payer: Self-pay | Admitting: Cardiology

## 2013-12-29 ENCOUNTER — Inpatient Hospital Stay (HOSPITAL_COMMUNITY): Admission: RE | Admit: 2013-12-29 | Payer: Commercial Managed Care - HMO | Source: Ambulatory Visit

## 2014-02-08 ENCOUNTER — Encounter: Payer: Self-pay | Admitting: Cardiology

## 2014-02-08 ENCOUNTER — Other Ambulatory Visit: Payer: Self-pay | Admitting: *Deleted

## 2014-02-08 ENCOUNTER — Ambulatory Visit (INDEPENDENT_AMBULATORY_CARE_PROVIDER_SITE_OTHER): Payer: Medicare HMO | Admitting: Cardiology

## 2014-02-08 VITALS — BP 130/72 | HR 61 | Ht 73.0 in | Wt 212.0 lb

## 2014-02-08 DIAGNOSIS — I252 Old myocardial infarction: Secondary | ICD-10-CM

## 2014-02-08 DIAGNOSIS — Z89511 Acquired absence of right leg below knee: Secondary | ICD-10-CM

## 2014-02-08 DIAGNOSIS — I10 Essential (primary) hypertension: Secondary | ICD-10-CM

## 2014-02-08 DIAGNOSIS — I2510 Atherosclerotic heart disease of native coronary artery without angina pectoris: Secondary | ICD-10-CM

## 2014-02-08 DIAGNOSIS — I255 Ischemic cardiomyopathy: Secondary | ICD-10-CM

## 2014-02-08 DIAGNOSIS — E1121 Type 2 diabetes mellitus with diabetic nephropathy: Secondary | ICD-10-CM
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
## 2014-02-08 NOTE — Patient Instructions (Addendum)
Please stop your Plavix. Continue all other medications as listed.  Please schedule a follow appointment with the Lipid Clinic.  Follow up in 6 months with Dr. Marlou Porch.  You will receive a letter in the mail 2 months before you are due.  Please call us when you receive this letter to schedule your follow up appointment.

## 2014-02-16 ENCOUNTER — Ambulatory Visit: Payer: Commercial Managed Care - HMO | Admitting: Pharmacist

## 2014-02-17 ENCOUNTER — Encounter (HOSPITAL_COMMUNITY): Payer: Self-pay | Admitting: Cardiovascular Disease

## 2014-02-23 ENCOUNTER — Ambulatory Visit: Payer: Commercial Managed Care - HMO | Admitting: Pharmacist

## 2014-03-14 ENCOUNTER — Ambulatory Visit: Payer: Commercial Managed Care - HMO | Admitting: Pharmacist

## 2014-03-24 ENCOUNTER — Encounter (HOSPITAL_COMMUNITY): Payer: Self-pay | Admitting: Cardiovascular Disease

## 2014-05-11 ENCOUNTER — Other Ambulatory Visit: Payer: Self-pay | Admitting: Internal Medicine

## 2014-05-11 ENCOUNTER — Ambulatory Visit (HOSPITAL_COMMUNITY)
Admission: RE | Admit: 2014-05-11 | Discharge: 2014-05-11 | Disposition: A | Discharge status: Home / Self Care | Payer: Commercial Managed Care - HMO | Source: Ambulatory Visit | Attending: Internal Medicine | Admitting: Internal Medicine

## 2014-05-11 ENCOUNTER — Ambulatory Visit (HOSPITAL_COMMUNITY)
Admission: RE | Admit: 2014-05-11 | Discharge: 2014-05-11 | Disposition: A | Discharge status: Home / Self Care | Payer: Commercial Managed Care - HMO | Source: Ambulatory Visit | Attending: Emergency Medicine | Admitting: Emergency Medicine

## 2014-05-11 DIAGNOSIS — R0602 Shortness of breath: Secondary | ICD-10-CM

## 2014-09-13 ENCOUNTER — Other Ambulatory Visit: Payer: Self-pay | Admitting: Cardiology

## 2014-09-14 NOTE — Telephone Encounter (Signed)
Ok to refill 

## 2014-09-14 NOTE — Telephone Encounter (Signed)
Is this ok to refill?  

## 2014-09-14 NOTE — Telephone Encounter (Signed)
Note was in comment - OK to refill but pt needs appt.

## 2016-05-25 IMAGING — US US RENAL
1 series · 14 of 20 positions shown · non-contrast
Comparison: None.

CLINICAL DATA: Acute renal failure.  Elevated creatinine.

EXAM:
RENAL/URINARY TRACT ULTRASOUND COMPLETE

[Series 1: us renal · 0.23mm/px · 14 of 20 slices shown]
[im 1/20]
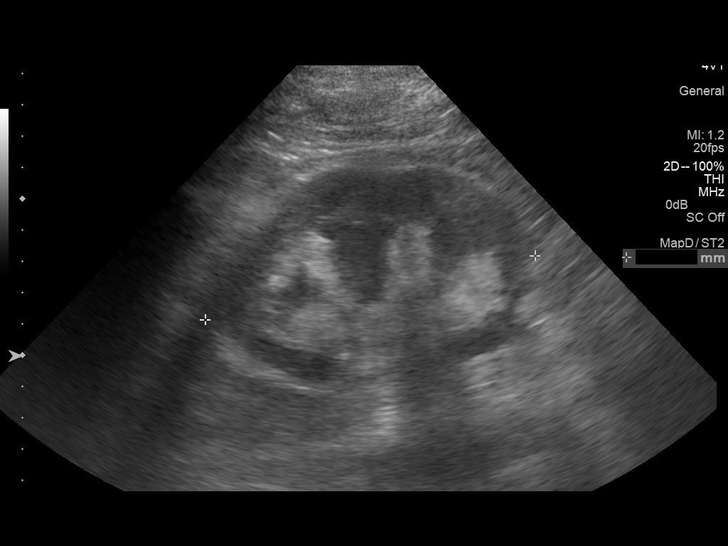
[im 3/20]
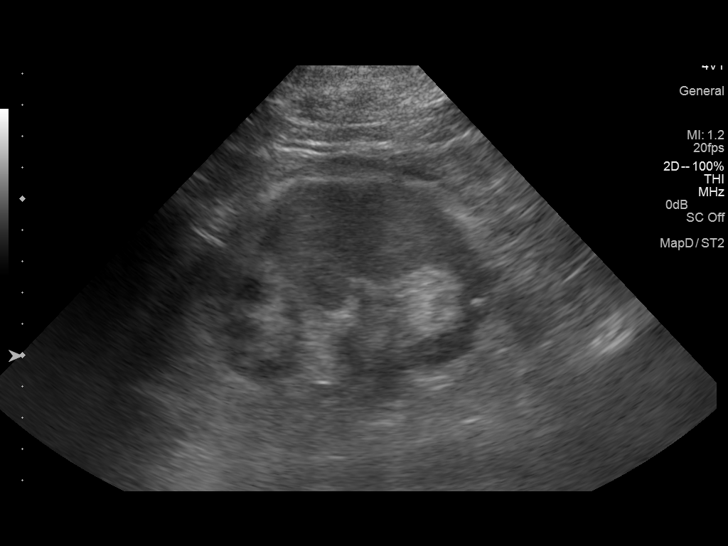
[im 4/20]
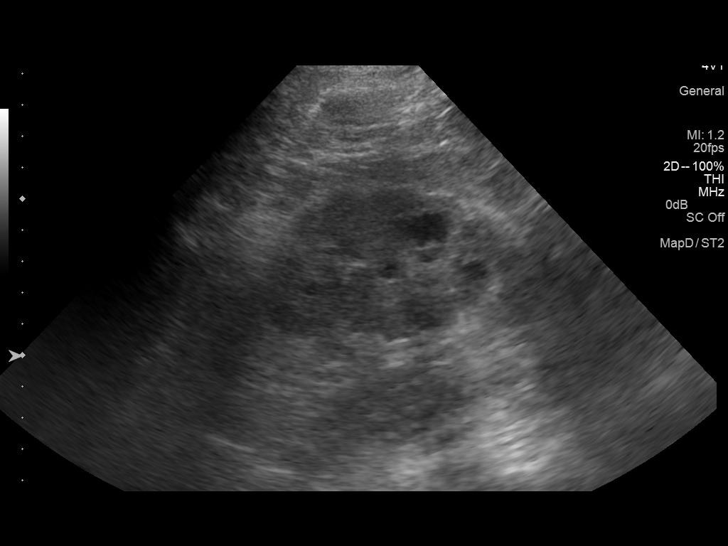
[im 6/20]
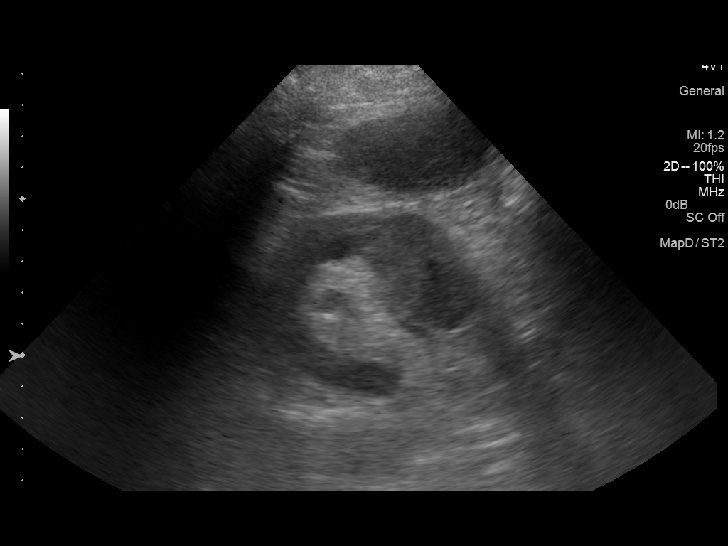
[im 7/20]
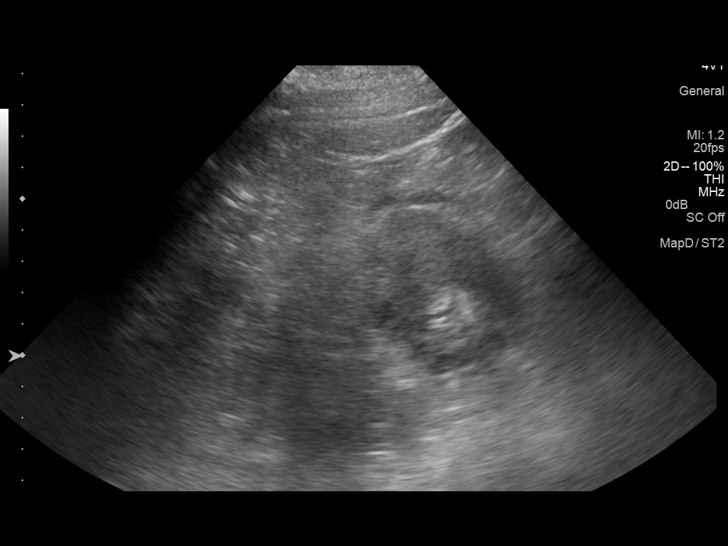
[im 8/20]
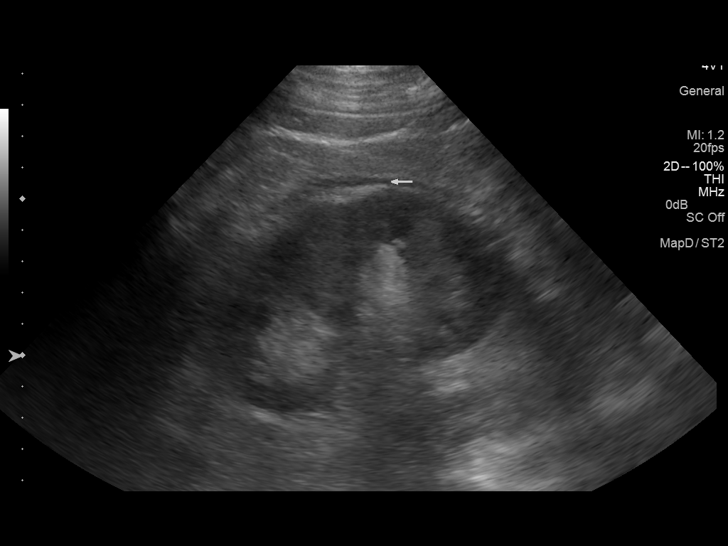
[im 10/20]
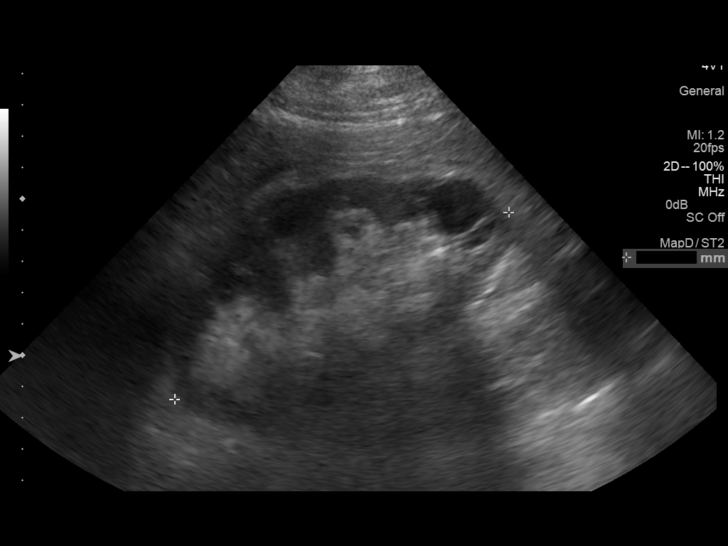
[im 11/20]
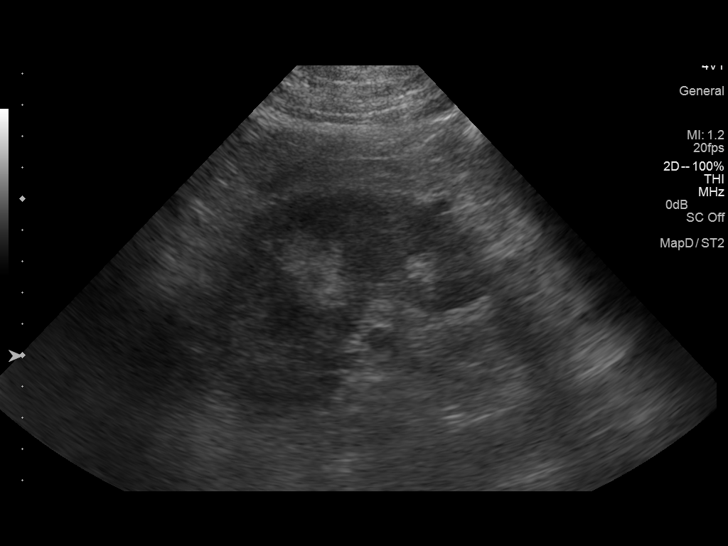
[im 13/20]
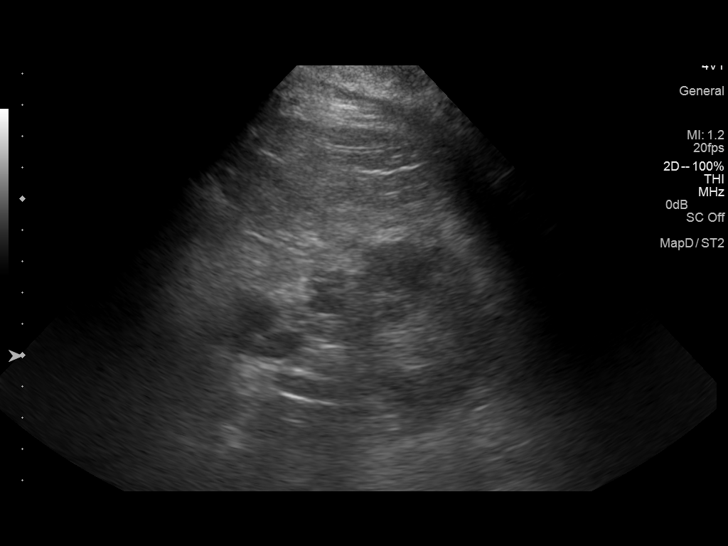
[im 14/20]
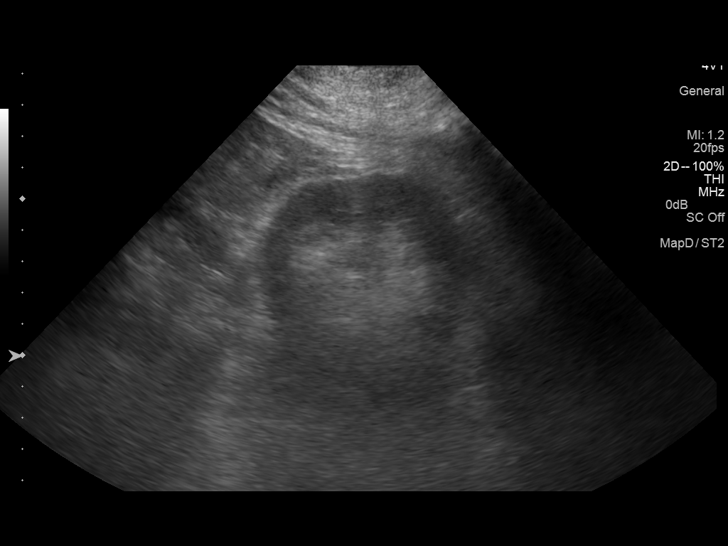
[im 16/20]
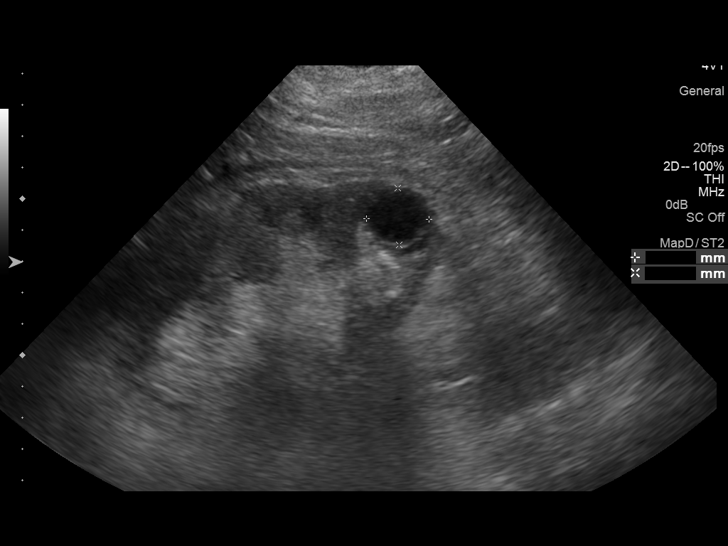
[im 17/20]
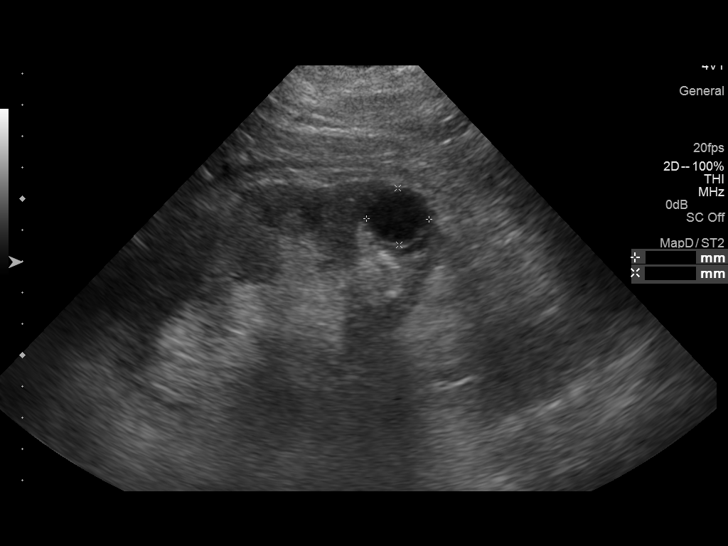
[im 18/20]
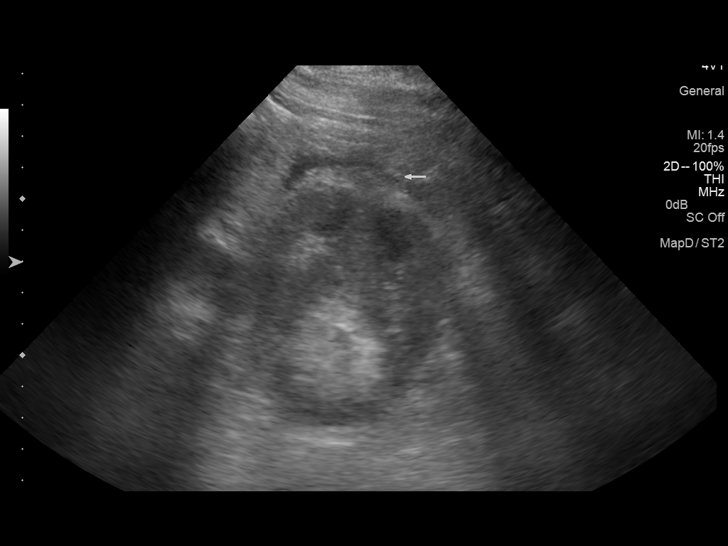
[im 20/20]
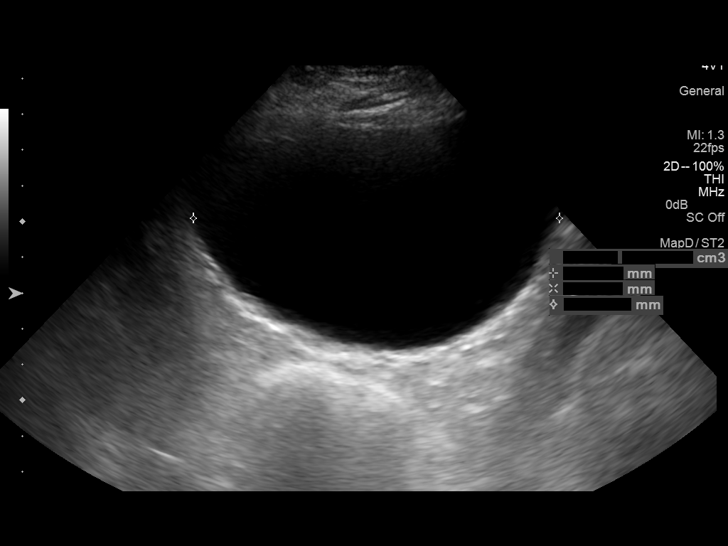

[14 of 20 positions shown; findings below may reference images not displayed]

FINDINGS: Right Kidney:

Length: 10.7 cm. Diffusely increased parenchymal echotexture with
mild diffuse parenchymal atrophy. Changes are consistent with
chronic medical renal disease. Multiple small circumscribed
low-attenuation lesions consistent with cysts. Largest is in the
upper pole and measures about 1.7 cm maximal diameter. Small amount
of fluid in the perirenal fat. No hydronephrosis.

Left Kidney:

Length: 12.2 cm. Diffusely increased parenchymal echotexture with
diffuse parenchymal atrophy suggesting chronic medical renal
disease. Small low-attenuation lesions in the kidney consistent with
cysts. Largest is in the lower pole and measures about 2 cm maximal
diameter. Small amount of fluid in the perirenal fat. No
hydronephrosis.

Bladder:

Appears normal for degree of bladder distention.
IMPRESSION: Bilateral parenchymal changes consistent with chronic medical renal
disease. Bilateral renal cysts. No hydronephrosis. Mild bilateral
perinephric edema.

## 2017-01-18 IMAGING — DX DG CHEST 2V
2 series · 2 of 2 positions shown · non-contrast
Comparison: None.

CLINICAL DATA: 68-year-old male with shortness of breath

EXAM:
CHEST  2 VIEW

[chest pa]
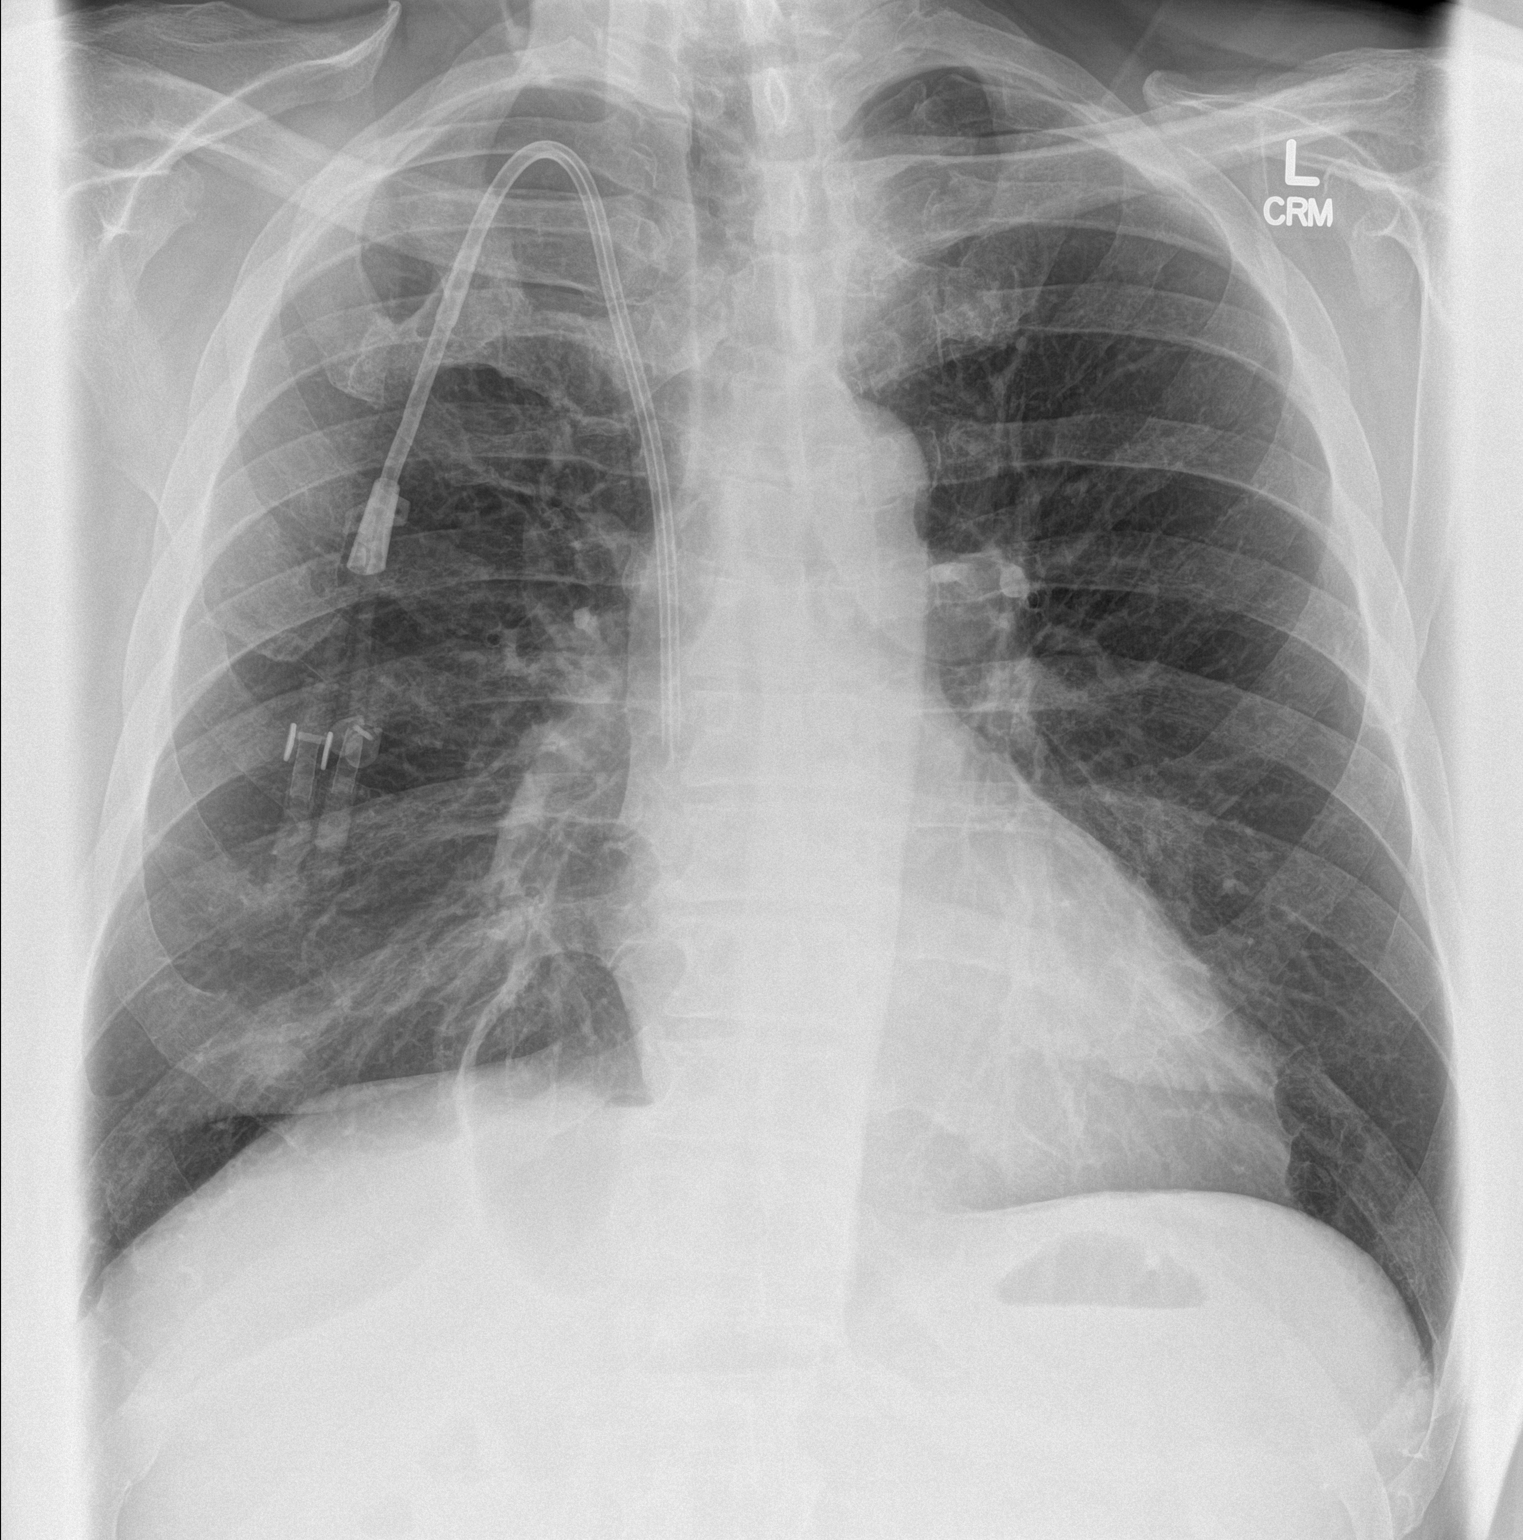

[chest lat]
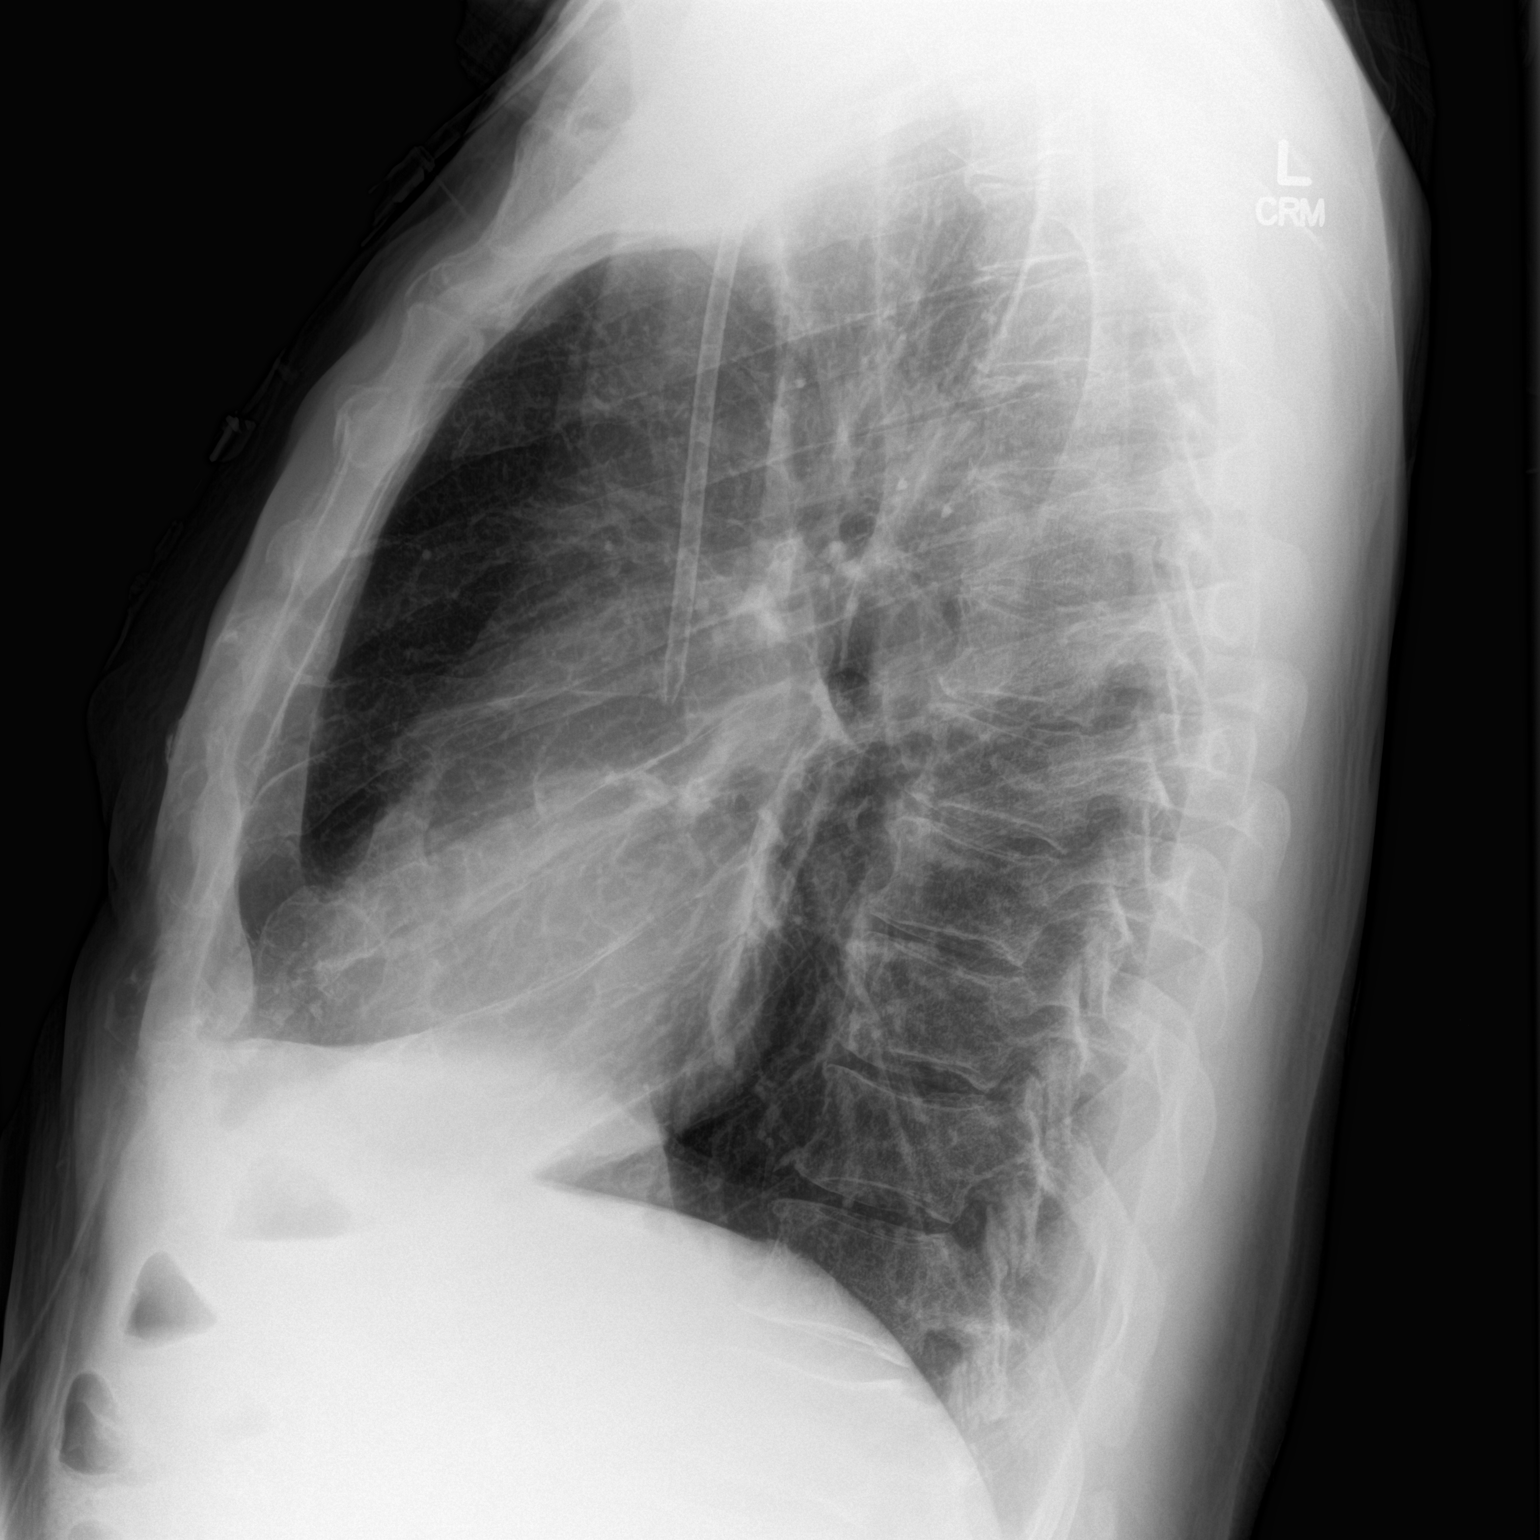

[2 of 2 positions shown; findings below may reference images not displayed]

FINDINGS: Right IJ approach tunneled hemodialysis catheter. Catheter tip in
good position overlying the distal SVC. Cardiac and mediastinal
contours are within normal limits. Linear atelectasis versus
scarring in the right middle lobe. Nonspecific 8 mm nodular shadow
in the right lung base on the frontal view may represent a prominent
nipple shadow. No focal consolidation, pleural effusion or
pneumothorax. Trace atherosclerotic calcification in the transverse
aorta. No acute osseous abnormality.
IMPRESSION: 1. No evidence of acute cardiopulmonary process.
2. Nonspecific 8 mm nodular opacity in the right lung base on the
frontal view which is favored to represent a prominent nipple
shadow. Recommend repeat frontal chest x-ray including both shallow
obliques with nipple markers in place. If the nodular opacity is
proven to be separate from the nipple shadow then further evaluation
with chest CT will be warranted.
3. Right middle lobe atelectasis versus linear scarring.
4. Well-positioned tunneled right IJ hemodialysis catheter with the
tip overlying the distal SVC.
5. Trace aortic atherosclerosis.

## 2018-04-11 DEATH — deceased
